# Patient Record
Sex: Male | Born: 1965 | Race: Black or African American | Hispanic: No | Marital: Married | Smoking: Never smoker
Health system: Southern US, Community
[De-identification: ages and names within clinical notes are randomized; demographics above are authoritative.]

## PROBLEM LIST (undated history)

## (undated) DIAGNOSIS — C801 Malignant (primary) neoplasm, unspecified: Secondary | ICD-10-CM

## (undated) DIAGNOSIS — E785 Hyperlipidemia, unspecified: Secondary | ICD-10-CM

## (undated) DIAGNOSIS — R972 Elevated prostate specific antigen [PSA]: Secondary | ICD-10-CM

## (undated) DIAGNOSIS — W3400XA Accidental discharge from unspecified firearms or gun, initial encounter: Secondary | ICD-10-CM

## (undated) DIAGNOSIS — I10 Essential (primary) hypertension: Secondary | ICD-10-CM

## (undated) DIAGNOSIS — Z981 Arthrodesis status: Secondary | ICD-10-CM

## (undated) DIAGNOSIS — R7303 Prediabetes: Secondary | ICD-10-CM

## (undated) HISTORY — PX: PROSTATE BIOPSY: SHX241

## (undated) HISTORY — DX: Essential (primary) hypertension: I10

## (undated) HISTORY — DX: Hyperlipidemia, unspecified: E78.5

## (undated) HISTORY — DX: Accidental discharge from unspecified firearms or gun, initial encounter: W34.00XA

## (undated) HISTORY — PX: ARTHROSCOPIC REPAIR ACL: SUR80

## (undated) HISTORY — DX: Arthrodesis status: Z98.1

---

## 1998-07-10 ENCOUNTER — Encounter: Payer: Self-pay | Admitting: Internal Medicine

## 1998-07-10 ENCOUNTER — Ambulatory Visit (HOSPITAL_COMMUNITY): Admission: RE | Admit: 1998-07-10 | Discharge: 1998-07-10 | Payer: Self-pay | Admitting: Internal Medicine

## 2005-07-08 ENCOUNTER — Ambulatory Visit: Payer: Self-pay | Admitting: Internal Medicine

## 2005-07-29 ENCOUNTER — Ambulatory Visit: Payer: Self-pay | Admitting: Internal Medicine

## 2005-09-09 ENCOUNTER — Ambulatory Visit: Payer: Self-pay | Admitting: Internal Medicine

## 2006-10-13 ENCOUNTER — Encounter: Payer: Self-pay | Admitting: Internal Medicine

## 2006-10-13 DIAGNOSIS — Z981 Arthrodesis status: Secondary | ICD-10-CM | POA: Insufficient documentation

## 2006-10-14 ENCOUNTER — Emergency Department (HOSPITAL_COMMUNITY): Admission: EM | Admit: 2006-10-14 | Discharge: 2006-10-14 | Payer: Self-pay | Admitting: *Deleted

## 2007-10-04 ENCOUNTER — Ambulatory Visit: Payer: Self-pay | Admitting: Internal Medicine

## 2007-10-05 DIAGNOSIS — E785 Hyperlipidemia, unspecified: Secondary | ICD-10-CM | POA: Insufficient documentation

## 2007-10-05 DIAGNOSIS — I10 Essential (primary) hypertension: Secondary | ICD-10-CM | POA: Insufficient documentation

## 2007-11-07 ENCOUNTER — Ambulatory Visit: Payer: Self-pay | Admitting: Internal Medicine

## 2007-11-07 LAB — CONVERTED CEMR LAB
ALT: 62 units/L — ABNORMAL HIGH (ref 0–53)
AST: 33 units/L (ref 0–37)
Albumin: 4 g/dL (ref 3.5–5.2)
Alkaline Phosphatase: 79 units/L (ref 39–117)
BUN: 19 mg/dL (ref 6–23)
Bilirubin, Direct: 0.2 mg/dL (ref 0.0–0.3)
CO2: 31 meq/L (ref 19–32)
Calcium: 9.2 mg/dL (ref 8.4–10.5)
Chloride: 105 meq/L (ref 96–112)
Cholesterol: 164 mg/dL (ref 0–200)
Creatinine, Ser: 1.2 mg/dL (ref 0.4–1.5)
GFR calc Af Amer: 86 mL/min
GFR calc non Af Amer: 71 mL/min
Glucose, Bld: 103 mg/dL — ABNORMAL HIGH (ref 70–99)
HDL: 42.4 mg/dL (ref >39.0)
LDL Cholesterol: 103 mg/dL — ABNORMAL HIGH (ref 0–99)
Potassium: 3.2 meq/L — ABNORMAL LOW (ref 3.5–5.1)
Sodium: 142 meq/L (ref 135–145)
Total Bilirubin: 1 mg/dL (ref 0.3–1.2)
Total CHOL/HDL Ratio: 3.9
Total Protein: 7 g/dL (ref 6.0–8.3)
Triglycerides: 93 mg/dL (ref 0–149)
VLDL: 19 mg/dL (ref 0–40)

## 2007-11-09 ENCOUNTER — Ambulatory Visit: Payer: Self-pay | Admitting: Internal Medicine

## 2007-11-09 DIAGNOSIS — E663 Overweight: Secondary | ICD-10-CM | POA: Insufficient documentation

## 2008-11-27 ENCOUNTER — Telehealth: Payer: Self-pay | Admitting: Internal Medicine

## 2010-08-20 ENCOUNTER — Telehealth: Payer: Self-pay | Admitting: *Deleted

## 2010-08-20 MED ORDER — HYDROCHLOROTHIAZIDE 25 MG PO TABS: 25.0000 mg | ORAL_TABLET | Freq: Every day | ORAL | Status: DC

## 2010-08-20 MED ORDER — SIMVASTATIN 40 MG PO TABS: 40.0000 mg | ORAL_TABLET | Freq: Every evening | ORAL | Status: DC

## 2010-08-20 NOTE — Telephone Encounter (Signed)
Requesting RX refills for Simvastatin 40 & HCTZ 25 to [facility]. Rx[s] Done. At Pt request, #15 of each Rx to [facility] [address] [out of meds] Done. Pt informed.

## 2010-11-12 LAB — POCT CARDIAC MARKERS
CKMB, poc: 1.9
Myoglobin, poc: 68.8
Operator id: 4074
Troponin i, poc: <0.05

## 2010-11-12 LAB — BASIC METABOLIC PANEL
BUN: 16
CO2: 28
Calcium: 9.1
Chloride: 111
Creatinine, Ser: 1.4
GFR calc Af Amer: >60
GFR calc non Af Amer: 56 — ABNORMAL LOW
Glucose, Bld: 103 — ABNORMAL HIGH
Potassium: 4
Sodium: 143

## 2011-05-21 ENCOUNTER — Other Ambulatory Visit: Payer: Self-pay | Admitting: Internal Medicine

## 2011-09-01 ENCOUNTER — Encounter: Payer: Self-pay | Admitting: Internal Medicine

## 2011-09-01 ENCOUNTER — Other Ambulatory Visit (INDEPENDENT_AMBULATORY_CARE_PROVIDER_SITE_OTHER): Payer: 59

## 2011-09-01 ENCOUNTER — Ambulatory Visit (INDEPENDENT_AMBULATORY_CARE_PROVIDER_SITE_OTHER): Payer: 59 | Admitting: Internal Medicine

## 2011-09-01 VITALS — BP 142/90 | HR 98 | Temp 99.0°F | Resp 16 | Wt 237.0 lb

## 2011-09-01 DIAGNOSIS — R6889 Other general symptoms and signs: Secondary | ICD-10-CM

## 2011-09-01 DIAGNOSIS — E785 Hyperlipidemia, unspecified: Secondary | ICD-10-CM

## 2011-09-01 DIAGNOSIS — I10 Essential (primary) hypertension: Secondary | ICD-10-CM

## 2011-09-01 DIAGNOSIS — R899 Unspecified abnormal finding in specimens from other organs, systems and tissues: Secondary | ICD-10-CM

## 2011-09-01 DIAGNOSIS — Z0000 Encounter for general adult medical examination without abnormal findings: Secondary | ICD-10-CM

## 2011-09-01 DIAGNOSIS — E663 Overweight: Secondary | ICD-10-CM

## 2011-09-01 DIAGNOSIS — Z23 Encounter for immunization: Secondary | ICD-10-CM

## 2011-09-01 LAB — COMPREHENSIVE METABOLIC PANEL
AST: 22 U/L (ref 0–37)
Albumin: 4.3 g/dL (ref 3.5–5.2)
Alkaline Phosphatase: 89 U/L (ref 39–117)
Potassium: 4.3 mEq/L (ref 3.5–5.1)
Sodium: 141 mEq/L (ref 135–145)
Total Bilirubin: 0.7 mg/dL (ref 0.3–1.2)
Total Protein: 7.6 g/dL (ref 6.0–8.3)

## 2011-09-01 LAB — LIPID PANEL
Cholesterol: 247 mg/dL — ABNORMAL HIGH (ref 0–200)
HDL: 46 mg/dL (ref >39.00)
Triglycerides: 152 mg/dL — ABNORMAL HIGH (ref 0.0–149.0)

## 2011-09-01 LAB — HEPATIC FUNCTION PANEL
ALT: 36 U/L (ref 0–53)
AST: 22 U/L (ref 0–37)
Albumin: 4.3 g/dL (ref 3.5–5.2)
Total Protein: 7.6 g/dL (ref 6.0–8.3)

## 2011-09-01 LAB — HEMOGLOBIN A1C: Hgb A1c MFr Bld: 4.9 % (ref 4.6–6.5)
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
## 2011-09-02 LAB — LDL CHOLESTEROL, DIRECT: Direct LDL: 178.1 mg/dL

## 2011-09-07 DIAGNOSIS — Z0000 Encounter for general adult medical examination without abnormal findings: Secondary | ICD-10-CM | POA: Insufficient documentation

## 2011-09-07 NOTE — Assessment & Plan Note (Signed)
BP Readings from Last 3 Encounters:  09/01/11 142/90  11/09/07 130/88  10/04/07 158/98   Borderline control.  Plan Continue present medications  Weight management  Monitor BP from time to time and call if SBP is consistently greater than 140.

## 2011-09-07 NOTE — Assessment & Plan Note (Signed)
BMI 30.3 - obesity class I  Discussed the importance of weight management: smart food choices, PORTION SIZE CONTROL and regular exercise.  Plan To loose weight: 1 before per month to a target BMI of 25-28.

## 2011-09-07 NOTE — Assessment & Plan Note (Signed)
Interval history w/o major illness, injury or surgery. Physical exam is normal except for weight. Lab results are in normal range except for LDL cholesterol. He is provided a tetanus shot at today's visit.  In summary - a nice man who is medically stable except for poorly controlled cholesterol. He is strongly encouraged to exercise and to loose weight. He will return for lab in 6 months otherwise he will return as needed.

## 2011-09-07 NOTE — Assessment & Plan Note (Signed)
LDL is above the treatment threshold of 160 or greater. Previous LDL in '09 was 103. Question of adherence to medical therapy.  Plan  Continue simvastatin 40 mg daily  Low fat diet  Repeat lab work in 6 months.

## 2011-11-07 ENCOUNTER — Other Ambulatory Visit: Payer: Self-pay | Admitting: Internal Medicine

## 2011-11-07 MED ORDER — HYDROCHLOROTHIAZIDE 25 MG PO TABS: 25.0000 mg | ORAL_TABLET | Freq: Every day | ORAL | Status: DC

## 2011-11-07 MED ORDER — SIMVASTATIN 40 MG PO TABS: 40.0000 mg | ORAL_TABLET | Freq: Every day | ORAL | Status: DC

## 2011-11-07 NOTE — Telephone Encounter (Signed)
MEDICATIONS ORDERED AND SENT TO [facility].

## 2011-11-07 NOTE — Telephone Encounter (Signed)
The pt called the triage line and is hoping to get a refill of his two medications , sent through [facility].  Thanks!

## 2012-11-29 ENCOUNTER — Other Ambulatory Visit: Payer: Self-pay | Admitting: Internal Medicine

## 2013-01-30 ENCOUNTER — Other Ambulatory Visit (INDEPENDENT_AMBULATORY_CARE_PROVIDER_SITE_OTHER): Payer: 59

## 2013-01-30 ENCOUNTER — Encounter: Payer: Self-pay | Admitting: Internal Medicine

## 2013-01-30 ENCOUNTER — Ambulatory Visit (INDEPENDENT_AMBULATORY_CARE_PROVIDER_SITE_OTHER): Payer: 59 | Admitting: Internal Medicine

## 2013-01-30 VITALS — BP 126/80 | HR 69 | Temp 98.5°F | Ht 73.0 in | Wt 239.0 lb

## 2013-01-30 DIAGNOSIS — E785 Hyperlipidemia, unspecified: Secondary | ICD-10-CM

## 2013-01-30 DIAGNOSIS — I10 Essential (primary) hypertension: Secondary | ICD-10-CM

## 2013-01-30 DIAGNOSIS — Z8042 Family history of malignant neoplasm of prostate: Secondary | ICD-10-CM

## 2013-01-30 DIAGNOSIS — E663 Overweight: Secondary | ICD-10-CM

## 2013-01-30 DIAGNOSIS — Z0000 Encounter for general adult medical examination without abnormal findings: Secondary | ICD-10-CM

## 2013-01-30 LAB — COMPREHENSIVE METABOLIC PANEL
Albumin: 4.5 g/dL (ref 3.5–5.2)
Alkaline Phosphatase: 79 U/L (ref 39–117)
Calcium: 9.3 mg/dL (ref 8.4–10.5)
Chloride: 103 mEq/L (ref 96–112)
Glucose, Bld: 90 mg/dL (ref 70–99)
Potassium: 3.8 mEq/L (ref 3.5–5.1)
Sodium: 139 mEq/L (ref 135–145)
Total Bilirubin: 1 mg/dL (ref 0.3–1.2)
Total Protein: 7.1 g/dL (ref 6.0–8.3)

## 2013-01-30 LAB — HEPATIC FUNCTION PANEL
ALT: 36 U/L (ref 0–53)
AST: 22 U/L (ref 0–37)
Albumin: 4.5 g/dL (ref 3.5–5.2)

## 2013-01-30 LAB — LIPID PANEL
Cholesterol: 174 mg/dL (ref 0–200)
LDL Cholesterol: 117 mg/dL — ABNORMAL HIGH (ref 0–99)
Total CHOL/HDL Ratio: 4
VLDL: 12.8 mg/dL (ref 0.0–40.0)

## 2013-01-30 LAB — PSA: PSA: 0.93 ng/mL (ref 0.10–4.00)

## 2013-01-30 NOTE — Progress Notes (Signed)
Pre visit review using our clinic review tool, if applicable. No additional management support is needed unless otherwise documented below in the visit note. 

## 2013-01-30 NOTE — Patient Instructions (Signed)
Thanks for coming in and I wish you a Happy New year.  Your exam is normal  Lab is ordered including the blood test for prostate cancer - PSA. Results will be posted to MyChart or mailed.  You are current with Tdap.  Have a great year.
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
## 2013-02-01 NOTE — Assessment & Plan Note (Signed)
Lipid panel with LDL better than goal of 130 or less; HDL is better than goal of 40+. Liver functions are normal.  Plan Continue present medications

## 2013-02-01 NOTE — Assessment & Plan Note (Signed)
BP Readings from Last 3 Encounters:  01/30/13 126/80  09/01/11 142/90  11/09/07 130/88   Adequate control on monotherapy with diuretic.

## 2013-02-01 NOTE — Assessment & Plan Note (Signed)
Interval history is negative for major illness, surgery or injury. Physical exam is normal. Lab results reviewed - in normal range. Immunization is current. Discussed pros and cons of prostate cancer screening (USPHCTF recommendations reviewed and ACU April '13 recommendations). He has two brothers with prostate cancer history. Due to increased risk PSA ordered - results: normal PSA @0 .93.  In summary A very nice man who is medical stable.

## 2013-02-01 NOTE — Assessment & Plan Note (Signed)
Body mass index is 31.54 kg/(m^2).  Diet management: smart food choices, PORTION SIZE CONTROL, regular exercise. Goal - to loose 1-2 lbs.month. Target weight - 200 lbs

## 2013-02-02 ENCOUNTER — Encounter: Payer: Self-pay | Admitting: Internal Medicine

## 2013-06-25 ENCOUNTER — Telehealth: Payer: Self-pay | Admitting: Internal Medicine

## 2013-06-25 NOTE — Telephone Encounter (Signed)
Both OK X 90 days

## 2013-06-25 NOTE — Telephone Encounter (Signed)
Pt called request refill for Cozor and Hydrodiuril [facility] (pt request 90 days). Pt is on list with Dr. Dorise Hiss. Please advise. Last ov with Norins was CPE 12/2012.

## 2013-06-26 MED ORDER — HYDROCHLOROTHIAZIDE 25 MG PO TABS: ORAL_TABLET | ORAL | Status: DC

## 2013-06-26 MED ORDER — SIMVASTATIN 40 MG PO TABS: ORAL_TABLET | ORAL | Status: DC

## 2013-06-26 NOTE — Telephone Encounter (Signed)
Spoke with pt advised Rx sent to [facility]

## 2013-09-02 ENCOUNTER — Ambulatory Visit (INDEPENDENT_AMBULATORY_CARE_PROVIDER_SITE_OTHER): Payer: 59 | Admitting: Internal Medicine

## 2013-09-02 ENCOUNTER — Encounter: Payer: Self-pay | Admitting: Internal Medicine

## 2013-09-02 ENCOUNTER — Other Ambulatory Visit: Payer: Self-pay | Admitting: Internal Medicine

## 2013-09-02 ENCOUNTER — Other Ambulatory Visit (INDEPENDENT_AMBULATORY_CARE_PROVIDER_SITE_OTHER): Payer: 59

## 2013-09-02 VITALS — BP 132/88 | HR 69 | Temp 98.8°F | Wt 249.4 lb

## 2013-09-02 DIAGNOSIS — E669 Obesity, unspecified: Secondary | ICD-10-CM | POA: Insufficient documentation

## 2013-09-02 DIAGNOSIS — E785 Hyperlipidemia, unspecified: Secondary | ICD-10-CM

## 2013-09-02 DIAGNOSIS — Z833 Family history of diabetes mellitus: Secondary | ICD-10-CM

## 2013-09-02 DIAGNOSIS — I10 Essential (primary) hypertension: Secondary | ICD-10-CM

## 2013-09-02 DIAGNOSIS — E663 Overweight: Secondary | ICD-10-CM

## 2013-09-02 LAB — HEPATIC FUNCTION PANEL
ALBUMIN: 4.1 g/dL (ref 3.5–5.2)
ALK PHOS: 84 U/L (ref 39–117)
ALT: 45 U/L (ref 0–53)
AST: 27 U/L (ref 0–37)
Bilirubin, Direct: 0.1 mg/dL (ref 0.0–0.3)
TOTAL PROTEIN: 7 g/dL (ref 6.0–8.3)
Total Bilirubin: 0.6 mg/dL (ref 0.2–1.2)

## 2013-09-02 LAB — BASIC METABOLIC PANEL
BUN: 16 mg/dL (ref 6–23)
CALCIUM: 9.3 mg/dL (ref 8.4–10.5)
CO2: 29 mEq/L (ref 19–32)
Chloride: 102 mEq/L (ref 96–112)
Creatinine, Ser: 1.1 mg/dL (ref 0.4–1.5)
GFR: 90.78 mL/min (ref >60.00)
GLUCOSE: 101 mg/dL — AB (ref 70–99)
Potassium: 4.1 mEq/L (ref 3.5–5.1)
Sodium: 136 mEq/L (ref 135–145)

## 2013-09-02 LAB — HEMOGLOBIN A1C: Hgb A1c MFr Bld: 5.3 % (ref 4.6–6.5)

## 2013-09-02 LAB — TSH: TSH: 1.39 u[IU]/mL (ref 0.35–4.50)

## 2013-09-02 NOTE — Assessment & Plan Note (Signed)
Blood pressure goals reviewed. BMET 

## 2013-09-02 NOTE — Progress Notes (Signed)
   Subjective:    Patient ID: [employer], Theodore Whitney    DOB: 08/14/1965, 48 y.o.   MRN: [id number]  HPI    He has been compliant with his antihypertensive and statin without adverse effect.  He is not on a low-salt heart healthy diet.  He is not monitoring his blood pressure at home.  He exercises 30 minutes 3-4 times a week as walking his dog  He has had no lab evaluation since December 2014. LDL was 117.  He is not on low-dose aspirin.  Family history is positive for hypertension in both parents. His father had a stroke at 8360; father diabetic      Review of Systems   Chest pain, palpitations, tachycardia, exertional dyspnea, paroxysmal nocturnal dyspnea, claudication or edema are absent.  He was inquiring about having skin tags removed       Objective:   Physical Exam Appears healthy and well-nourished & in no acute distress  He has a mustache and beard.  Arcus senilis is present bilaterally.  No ear lobe crease  present  No carotid bruits are present.No neck pain distention present at 10 - 15 degrees. Thyroid normal to palpation  Heart rhythm and rate are normal with no gallop or murmur  Chest is clear with no increased work of breathing  There is no evidence of aortic aneurysm or renal artery bruits  Abdomen Protuberant but soft with no organomegaly or masses. Ventral hernia.No HJR  No clubbing, cyanosis or edema present.  Pedal pulses are intact   No ischemic skin changes are present . Minor skin tags present with no sign of inflammation or pathologic change. Fingernails healthy   Alert and oriented. Strength, tone, DTRs reflexes normal          Assessment & Plan:  See Current Assessment & Plan in Problem List under specific Diagnosis

## 2013-09-02 NOTE — Progress Notes (Signed)
   Subjective:    Patient ID: [employer], male    DOB: 1965/10/06, 48 y.o.   MRN: [id number]  HPI Blood pressure range / average : Not monitored  Compliant with anti hypertensive medication. No lightheadedness or other adverse medication effect described.  A heart healthy /low salt diet is not followed. Exercise encompasses 30 minutes 3-4  times per week as  walking without symptoms.  Family history is positive for HTN- Mother, Father   Positive for CVA- Father about age 46  Last cholesterol testing 01/30/13 reveals 117 LDL,  goal is less than 100.  There is medication compliance with the statin.  Low dose ASA is not taken  Weight was discussed as brought up by patient. He now has a treadmill at home and would like to lose some weight.     Review of Systems Significant headaches, epistaxis, chest pain, palpitations, exertional dyspnea, claudication, paroxysmal nocturnal dyspnea, or edema absent.  Significant abdominal symptoms, memory deficit, or myalgias not present.    Objective:   Physical Exam  Constitutional: He is oriented to person, place, and time. No distress.  Muscular build  HENT:  Right Ear: External ear normal.  Left Ear: External ear normal.  Mouth/Throat: Oropharynx is clear and moist. No oropharyngeal exudate.  Eyes: Conjunctivae and EOM are normal. Pupils are equal, round, and reactive to light. Right eye exhibits no discharge. Left eye exhibits no discharge. No scleral icterus.  Neck: Normal range of motion. Neck supple. No JVD present. No tracheal deviation present. No thyromegaly present.  Cardiovascular: Normal rate, regular rhythm, normal heart sounds and intact distal pulses.  Exam reveals no gallop and no friction rub.   No murmur heard. Pulmonary/Chest: Effort normal and breath sounds normal. No respiratory distress. He has no wheezes. He has no rales. He exhibits no tenderness.  Abdominal: Soft. Bowel sounds are normal. He exhibits no distension and  no mass. There is no tenderness. There is no rebound and no guarding.  Musculoskeletal: Normal range of motion. He exhibits no edema and no tenderness.  Right knee had ACL surgery 15+ years ago, Crepitus noted on ROM  Lymphadenopathy:    He has no cervical adenopathy.  Neurological: He is alert and oriented to person, place, and time. He displays normal reflexes. No cranial nerve deficit. He exhibits normal muscle tone. Coordination normal.  Skin: Skin is warm and dry. No rash noted. He is not diaphoretic. No erythema. No pallor.  Psychiatric: He has a normal mood and affect. His behavior is normal. Judgment and thought content normal.          Assessment & Plan:

## 2013-09-02 NOTE — Progress Notes (Signed)
Pre visit review using our clinic review tool, if applicable. No additional management support is needed unless otherwise documented below in the visit note. 

## 2013-09-02 NOTE — Assessment & Plan Note (Signed)
Lipids, LFTs, TSH  

## 2013-09-02 NOTE — Patient Instructions (Signed)
Your next office appointment will be determined based upon review of your pending labs. Those instructions will be transmitted to you through My Chart  Minimal Blood Pressure Goal= AVERAGE < 140/90;  Ideal is an AVERAGE < 135/85. This AVERAGE should be calculated from @ least 5-7 BP readings taken @ different times of day on different days of week. You should not respond to isolated BP readings , but rather the AVERAGE for that week .Please bring your  blood pressure cuff to office visits to verify that it is reliable.It  can also be checked against the blood pressure device at the pharmacy. Finger or wrist cuffs are not dependable; an arm cuff is. Eat a low-fat diet with lots of fruits and vegetables, up to 7-9 servings per day. Consume less than 40  Grams (preferably ZERO) of sugar per day from foods & drinks with High Fructose Corn Syrup (HFCS) sugar as #1,2,3 or # 4 on label.Whole Foods, [employer] & [employer] do not carry products with HFCS. Follow a  low carb nutrition program such as [place] or The New Sugar Busters  to prevent Diabetes  . White carbohydrates (potatoes, rice, bread, and pasta) have a high spike of sugar and a high load of sugar. For example a  baked potato has a cup of sugar and a  french fry  2 teaspoons of sugar. Yams, wild  rice, whole grained bread &  wheat pasta have been much lower spike and load of  sugar. Portions should be the size of a deck of cards or your palm.

## 2013-09-02 NOTE — Assessment & Plan Note (Signed)
See AVS

## 2013-09-03 LAB — NMR LIPOPROFILE WITH LIPIDS
CHOLESTEROL, TOTAL: 183 mg/dL (ref <200)
HDL PARTICLE NUMBER: 30 umol/L — AB (ref >=30.5)
HDL Size: 8.8 nm — ABNORMAL LOW (ref >=9.2)
HDL-C: 48 mg/dL (ref >=40)
LARGE VLDL-P: 1 nmol/L (ref <=2.7)
LDL (calc): 113 mg/dL — ABNORMAL HIGH (ref <100)
LDL PARTICLE NUMBER: 1891 nmol/L — AB (ref <1000)
LDL Size: 20.6 nm (ref >20.5)
LP-IR Score: 45 (ref <=45)
Large HDL-P: 4.8 umol/L (ref >=4.8)
SMALL LDL PARTICLE NUMBER: 890 nmol/L — AB (ref <=527)
Triglycerides: 112 mg/dL (ref <150)
VLDL Size: 50.3 nm — ABNORMAL HIGH (ref <=46.6)

## 2013-09-09 ENCOUNTER — Encounter: Payer: Self-pay | Admitting: Internal Medicine

## 2014-02-03 ENCOUNTER — Encounter: Payer: Self-pay | Admitting: Internal Medicine

## 2014-02-03 ENCOUNTER — Ambulatory Visit (INDEPENDENT_AMBULATORY_CARE_PROVIDER_SITE_OTHER): Payer: 59 | Admitting: Internal Medicine

## 2014-02-03 ENCOUNTER — Other Ambulatory Visit (INDEPENDENT_AMBULATORY_CARE_PROVIDER_SITE_OTHER): Payer: Self-pay

## 2014-02-03 VITALS — BP 138/92 | HR 80 | Temp 98.2°F | Resp 18 | Ht 73.0 in | Wt 244.6 lb

## 2014-02-03 DIAGNOSIS — I10 Essential (primary) hypertension: Secondary | ICD-10-CM

## 2014-02-03 DIAGNOSIS — Z0000 Encounter for general adult medical examination without abnormal findings: Secondary | ICD-10-CM

## 2014-02-03 DIAGNOSIS — M25561 Pain in right knee: Secondary | ICD-10-CM

## 2014-02-03 LAB — PSA: PSA: 0.94 ng/mL (ref 0.10–4.00)

## 2014-02-03 MED ORDER — DICLOFENAC SODIUM 1 % TD GEL: 2.0000 g | Freq: Two times a day (BID) | TRANSDERMAL | Status: DC | PRN

## 2014-02-03 NOTE — Progress Notes (Signed)
Pre visit review using our clinic review tool, if applicable. No additional management support is needed unless otherwise documented below in the visit note. 

## 2014-02-03 NOTE — Assessment & Plan Note (Signed)
Trial voltaren gel. Advised continued icing and gentle exercising. If no relief with voltaren or he starts to have instability he will return to orthopedics.

## 2014-02-03 NOTE — Assessment & Plan Note (Signed)
Doing well with HCTZ and labs normal 8/15 reviewed.

## 2014-02-03 NOTE — Assessment & Plan Note (Signed)
He declines flu, wants PSA given family history of prostate troubles early in life.

## 2014-02-03 NOTE — Progress Notes (Signed)
   Subjective:    Patient ID: Theodore Whitney, male    DOB: 1965-07-29, 49 y.o.   MRN: [id number]  HPI The patient is a 49 YO man who is coming in to establish care. He has PMH of HTN and hyperlipidemia. He also has been having some right knee pain and swelling since the holidays. He has been icing it and having some mild pain throughout the day with work. He had ACL repair years ago. Denies collapse or giving out. He denies chest pains, SOB, abdominal pains.   Review of Systems  Constitutional: Negative for fever, activity change, appetite change, fatigue and unexpected weight change.  HENT: Negative.   Respiratory: Negative for cough, chest tightness, shortness of breath and wheezing.   Cardiovascular: Negative for chest pain, palpitations and leg swelling.  Gastrointestinal: Negative for abdominal pain, diarrhea, constipation and abdominal distention.  Musculoskeletal: Positive for arthralgias. Negative for myalgias, back pain and gait problem.  Skin: Negative.   Neurological: Negative for dizziness, weakness, light-headedness and headaches.      Objective:   Physical Exam  Constitutional: He is oriented to person, place, and time. He appears well-developed and well-nourished.  HENT:  Head: Normocephalic and atraumatic.  Eyes: EOM are normal.  Neck: Normal range of motion.  Cardiovascular: Normal rate and regular rhythm.   No murmur heard. Pulmonary/Chest: Effort normal and breath sounds normal. No respiratory distress. He has no wheezes. He has no rales.  Abdominal: Soft. Bowel sounds are normal. He exhibits no distension. There is no tenderness. There is no rebound.  Musculoskeletal: He exhibits tenderness. He exhibits no edema.  Mild tenderness along the patella, no tenderness along the pes anserine bursa, mcl, lcl, ACL and PCL intact. Mild swelling of the knee but minimal.   Neurological: He is alert and oriented to person, place, and time. Coordination normal.  Skin: Skin is  warm and dry.   Filed Vitals:   02/03/14 1009  BP: 138/92  Pulse: 80  Temp: 98.2 F (36.8 C)  TempSrc: Oral  Resp: 18  Height:  (1.854 m)  Weight: 244 lb 9.6 oz (110.95 kg)  SpO2: 97%      Assessment & Plan:

## 2014-02-03 NOTE — Patient Instructions (Signed)
We will check your psa today. We will also send in a medicine in a gel called voltaren. You can use it on your knee twice a day for pain or swelling. I would try using it twice a day for about 1 week to see if it helps. If you are still having problems you can go back to [place]. If they need a new referral just call us and we can send that in for you.  We will see you back in about 6-12 months for a check in. If you are having any new problems or questions before then please feel free to call our office.

## 2014-03-19 ENCOUNTER — Telehealth: Payer: Self-pay | Admitting: Internal Medicine

## 2014-03-19 ENCOUNTER — Other Ambulatory Visit: Payer: Self-pay | Admitting: Geriatric Medicine

## 2014-03-19 MED ORDER — HYDROCHLOROTHIAZIDE 25 MG PO TABS: ORAL_TABLET | ORAL | Status: DC

## 2014-03-19 MED ORDER — DICLOFENAC SODIUM 1 % TD GEL: 2.0000 g | Freq: Two times a day (BID) | TRANSDERMAL | Status: DC | PRN

## 2014-03-19 NOTE — Telephone Encounter (Signed)
Pt called request for diclofenac sodium (VOLTAREN) 1 % GEL to be send to [facility] on [address], and hydrochlorothiazide (HYDRODIURIL) 25 MG tablet and simvastatin [facility] for 90 days supply.

## 2014-03-19 NOTE — Telephone Encounter (Signed)
Sent to indicated pharmacies.

## 2014-03-26 ENCOUNTER — Telehealth: Payer: Self-pay | Admitting: *Deleted

## 2014-03-26 MED ORDER — HYDROCHLOROTHIAZIDE 25 MG PO TABS: ORAL_TABLET | ORAL | Status: DC

## 2014-03-26 MED ORDER — SIMVASTATIN 40 MG PO TABS: ORAL_TABLET | ORAL | Status: DC

## 2014-03-26 NOTE — Telephone Encounter (Signed)
Left msg on triage trying to get [facility]. Called pt back no answer LMOM RTC...Raechel Chute/lmb

## 2014-03-26 NOTE — Telephone Encounter (Signed)
Pt return call back he stated that he is needing his simvastatin & HCTZ currently out of both meds. inform him md sent refill on HCTZ 03/19/14. Will send both meds again, also [facility] since he is out...Raechel Chute/lmb

## 2015-08-14 ENCOUNTER — Telehealth: Payer: Self-pay

## 2015-08-14 MED ORDER — HYDROCHLOROTHIAZIDE 25 MG PO TABS: ORAL_TABLET | ORAL | Status: DC

## 2015-08-14 MED ORDER — SIMVASTATIN 40 MG PO TABS: ORAL_TABLET | ORAL | Status: DC

## 2015-08-14 NOTE — Telephone Encounter (Signed)
Pt called requesting 1 week supply of cholesterol and BP medication until appointment with Dr Okey Duprerawford on 08/21/2015.  Rx refill sent

## 2015-08-21 ENCOUNTER — Other Ambulatory Visit: Payer: Self-pay | Admitting: *Deleted

## 2015-08-21 ENCOUNTER — Encounter: Payer: Self-pay | Admitting: Internal Medicine

## 2015-08-21 MED ORDER — HYDROCHLOROTHIAZIDE 25 MG PO TABS: ORAL_TABLET | ORAL | Status: DC

## 2015-08-21 MED ORDER — SIMVASTATIN 40 MG PO TABS: ORAL_TABLET | ORAL | Status: DC

## 2015-08-21 NOTE — Telephone Encounter (Signed)
Pt left msg on triage yesterday afternoon stating he had to reschedule his appt to 7/31, needing to get a refill on meds until he come in. Called pt no ander will only send in #10 pills until appt must keep appt for future refills...Raechel Chute/lmb

## 2015-08-31 ENCOUNTER — Telehealth: Payer: Self-pay

## 2015-08-31 ENCOUNTER — Other Ambulatory Visit (INDEPENDENT_AMBULATORY_CARE_PROVIDER_SITE_OTHER): Payer: 59

## 2015-08-31 ENCOUNTER — Ambulatory Visit (INDEPENDENT_AMBULATORY_CARE_PROVIDER_SITE_OTHER): Payer: 59 | Admitting: Internal Medicine

## 2015-08-31 ENCOUNTER — Encounter: Payer: Self-pay | Admitting: Internal Medicine

## 2015-08-31 VITALS — BP 122/82 | HR 89 | Temp 98.0°F | Wt 242.0 lb

## 2015-08-31 DIAGNOSIS — E785 Hyperlipidemia, unspecified: Secondary | ICD-10-CM

## 2015-08-31 DIAGNOSIS — I10 Essential (primary) hypertension: Secondary | ICD-10-CM | POA: Diagnosis not present

## 2015-08-31 DIAGNOSIS — Z0000 Encounter for general adult medical examination without abnormal findings: Secondary | ICD-10-CM

## 2015-08-31 LAB — CBC
HCT: 41.4 % (ref 39.0–52.0)
Hemoglobin: 14 g/dL (ref 13.0–17.0)
MCHC: 33.8 g/dL (ref 30.0–36.0)
MCV: 95.2 fl (ref 78.0–100.0)
PLATELETS: 253 10*3/uL (ref 150.0–400.0)
RBC: 4.34 Mil/uL (ref 4.22–5.81)
RDW: 13.2 % (ref 11.5–15.5)
WBC: 4.9 10*3/uL (ref 4.0–10.5)

## 2015-08-31 LAB — COMPREHENSIVE METABOLIC PANEL
ALK PHOS: 95 U/L (ref 39–117)
ALT: 30 U/L (ref 0–53)
AST: 21 U/L (ref 0–37)
Albumin: 4.3 g/dL (ref 3.5–5.2)
BILIRUBIN TOTAL: 0.8 mg/dL (ref 0.2–1.2)
BUN: 15 mg/dL (ref 6–23)
CO2: 29 mEq/L (ref 19–32)
Calcium: 9.5 mg/dL (ref 8.4–10.5)
Chloride: 101 mEq/L (ref 96–112)
Creatinine, Ser: 1.22 mg/dL (ref 0.40–1.50)
GFR: 81.55 mL/min (ref >60.00)
GLUCOSE: 102 mg/dL — AB (ref 70–99)
POTASSIUM: 3.4 meq/L — AB (ref 3.5–5.1)
SODIUM: 139 meq/L (ref 135–145)
TOTAL PROTEIN: 7.1 g/dL (ref 6.0–8.3)

## 2015-08-31 LAB — LIPID PANEL
Cholesterol: 185 mg/dL (ref 0–200)
HDL: 43.5 mg/dL (ref >39.00)
LDL Cholesterol: 118 mg/dL — ABNORMAL HIGH (ref 0–99)
NONHDL: 141.47
Total CHOL/HDL Ratio: 4
Triglycerides: 115 mg/dL (ref 0.0–149.0)
VLDL: 23 mg/dL (ref 0.0–40.0)

## 2015-08-31 LAB — PSA: PSA: 1.3 ng/mL (ref 0.10–4.00)

## 2015-08-31 MED ORDER — SIMVASTATIN 40 MG PO TABS: ORAL_TABLET | ORAL | 1 refills | Status: DC

## 2015-08-31 MED ORDER — HYDROCHLOROTHIAZIDE 25 MG PO TABS: ORAL_TABLET | ORAL | 1 refills | Status: DC

## 2015-08-31 NOTE — Assessment & Plan Note (Signed)
BP at goal on hctz 25 mg daily, refill sent in. Checking CMP and adjust as needed.

## 2015-08-31 NOTE — Progress Notes (Signed)
Pre visit review using our clinic review tool, if applicable. No additional management support is needed unless otherwise documented below in the visit note. 

## 2015-08-31 NOTE — Telephone Encounter (Signed)
Error

## 2015-08-31 NOTE — Telephone Encounter (Signed)
error 

## 2015-08-31 NOTE — Assessment & Plan Note (Signed)
Tdap and flu shot up to date. No indication for early colon cancer screening. Checking labs today and counseling on sun safety and mole surveillance. Given screening recommendations.

## 2015-08-31 NOTE — Assessment & Plan Note (Signed)
Checking lipid panel and adjust as needed. Taking simvastatin 40 mg daily.  

## 2015-08-31 NOTE — Progress Notes (Signed)
   Subjective:    Patient ID: Theodore Whitney, male    DOB: 1965/06/03, 50 y.o.   MRN: [id number]  HPI The patient is a 50 YO man coming in for wellness. Non-smoker and exercises some.   PMH, [facility], social history reviewed and updated.   Review of Systems  Constitutional: Negative for activity change, appetite change, fatigue, fever and unexpected weight change.  HENT: Negative.   Eyes: Negative.   Respiratory: Negative for cough, chest tightness, shortness of breath and wheezing.   Cardiovascular: Negative for chest pain, palpitations and leg swelling.  Gastrointestinal: Negative for abdominal distention, abdominal pain, constipation and diarrhea.  Musculoskeletal: Positive for arthralgias. Negative for back pain, gait problem and myalgias.  Skin: Negative.   Neurological: Negative for dizziness, weakness, light-headedness and headaches.  Psychiatric/Behavioral: Negative.       Objective:   Physical Exam  Constitutional: He is oriented to person, place, and time. He appears well-developed and well-nourished.  HENT:  Head: Normocephalic and atraumatic.  Eyes: EOM are normal.  Neck: Normal range of motion.  Cardiovascular: Normal rate and regular rhythm.   No murmur heard. Pulmonary/Chest: Effort normal and breath sounds normal. No respiratory distress. He has no wheezes. He has no rales.  Abdominal: Soft. Bowel sounds are normal. He exhibits no distension. There is no tenderness. There is no rebound.  Musculoskeletal: He exhibits no edema or tenderness.  Neurological: He is alert and oriented to person, place, and time. Coordination normal.  Skin: Skin is warm and dry.  Psychiatric: He has a normal mood and affect.   Vitals:   08/31/15 1326  BP: 122/82  Pulse: 89  Temp: 98 F (36.7 C)  SpO2: 98%  Weight: 242 lb (109.8 kg)      Assessment & Plan:

## 2015-08-31 NOTE — Patient Instructions (Signed)
We are checking the labs today and will send the results on mychart.   Serving Sizes A serving size is a measured amount of food or drink, such as one slice of bread, that has an associated nutrient content. Knowing the serving size of a food or drink can help you determine how much of that food you should consume.  WHAT IS THE SIZE OF ONE SERVING? The size of one healthy serving depends on the food or drink. To determine a serving size, read the food label. If the food or drink does not have a food label, try to find serving size information online. Or, use the following to estimate the size of one adult serving:  Grain 1 slice bread.  bagel.  cup pasta.  Vegetable  cup cooked or canned vegetables. 1 cup raw, leafy greens.  Fruit  cup canned fruit. 1 medium fruit.  cup dried fruit.  Meat and Other Protein Sources 1 oz meat, poultry, or fish.  cup cooked beans. 1 egg.  cup nuts or seeds. 1 Tbsp nut butter.  cup tofu or tempeh. 2 Tbsp hummus.  Dairy An individual container of yogurt (6-8 oz). 1 piece of cheese the size of your thumb (1 oz). 1 cup (8 oz) milk or milk alternative.  Fat A piece the size of one dice. 1 tsp soft margarine. 1 Tbsp mayonnaise. 1 tsp vegetable oil. 1 Tbsp regular salad dressing. 2 Tbsp low-fat salad dressing.  HOW MANY SERVINGS SHOULD I EAT FROM EACH FOOD GROUP EACH DAY?  The following are the suggested number of servings to try and have every day from each food group. You can also look at your eating throughout the week and aim for meeting these requirements on most days for overall healthy eating.  Grain 6-8 servings. Try to have half of your grains from whole grains, such as whole wheat bread, corn tortillas, oatmeal, brown rice, whole wheat pasta, and bulgur. Vegetable At least 2-3 servings.  Fruit 2 servings.  Meat and Other Protein Foods 5-6 servings. Aim to have lean proteins, such as chicken, [place], fish, beans, or tofu. Dairy 3 servings. Choose  low-fat or nonfat if you are trying to control your weight.  Fat 2-3 servings.  IS A SERVING THE SAME THING AS A PORTION? No. A portion is the actual amount you eat, which may be more than one serving. Knowing the specific serving size of a food and the nutritional information that goes with it can help you make a healthy decision on what size portion to eat.  WHAT ARE SOME TIPS TO HELP ME LEARN HEALTHY SERVING SIZES?  Check food labels for serving sizes. Many foods that come as a single portion actually contain multiple servings.  Determine the serving size of foods you commonly eat and figure out how large a portion you usually eat.  Measure the number of servings that can be held by the bowls, glasses, cups, and plates you typically use. For example, pour your breakfast cereal into your regular bowl and then pour it into a measuring cup.  For 1-2 days, measure the serving sizes of all the foods you eat.  Practice estimating serving sizes and determining how big your portions should be.   This information is not intended to replace advice given to you by your health care provider. Make sure you discuss any questions you have with your health care provider.   Document Released: 10/16/2002 Document Revised: 02/07/2014 Document Reviewed: 04/16/2013 Elsevier Interactive Patient Education  2016 Elsevier Inc.  Health Maintenance, Male A healthy lifestyle and preventative care can promote health and wellness.  Maintain regular health, dental, and eye exams.  Eat a healthy diet. Foods like vegetables, fruits, whole grains, low-fat dairy products, and lean protein foods contain the nutrients you need and are low in calories. Decrease your intake of foods high in solid fats, added sugars, and salt. Get information about a proper diet from your health care provider, if necessary.  Regular physical exercise is one of the most important things you can do for your health. Most adults should get at  least 150 minutes of moderate-intensity exercise (any activity that increases your heart rate and causes you to sweat) each week. In addition, most adults need muscle-strengthening exercises on 2 or more days a week.   Maintain a healthy weight. The body mass index (BMI) is a screening tool to identify possible weight problems. It provides an estimate of body fat based on height and weight. Your health care provider can find your BMI and can help you achieve or maintain a healthy weight. For males 20 years and older:  A BMI below 18.5 is considered underweight.  A BMI of 18.5 to 24.9 is normal.  A BMI of 25 to 29.9 is considered overweight.  A BMI of 30 and above is considered obese.  Maintain normal blood lipids and cholesterol by exercising and minimizing your intake of saturated fat. Eat a balanced diet with plenty of fruits and vegetables. Blood tests for lipids and cholesterol should begin at age 66 and be repeated every 5 years. If your lipid or cholesterol levels are high, you are over age 58, or you are at high risk for heart disease, you may need your cholesterol levels checked more frequently.Ongoing high lipid and cholesterol levels should be treated with medicines if diet and exercise are not working.  If you smoke, find out from your health care provider how to quit. If you do not use tobacco, do not start.  Lung cancer screening is recommended for adults aged 55-80 years who are at high risk for developing lung cancer because of a history of smoking. A yearly low-dose CT scan of the lungs is recommended for people who have at least a 30-pack-year history of smoking and are current smokers or have quit within the past 15 years. A pack year of smoking is smoking an average of 1 pack of cigarettes a day for 1 year (for example, a 30-pack-year history of smoking could mean smoking 1 pack a day for 30 years or 2 packs a day for 15 years). Yearly screening should continue until the smoker  has stopped smoking for at least 15 years. Yearly screening should be stopped for people who develop a health problem that would prevent them from having lung cancer treatment.  If you choose to drink alcohol, do not have more than 2 drinks per day. One drink is considered to be 12 oz (360 mL) of beer, 5 oz (150 mL) of wine, or 1.5 oz (45 mL) of liquor.  Avoid the use of street drugs. Do not share needles with anyone. Ask for help if you need support or instructions about stopping the use of drugs.  High blood pressure causes heart disease and increases the risk of stroke. High blood pressure is more likely to develop in:  People who have blood pressure in the end of the normal range (100-139/85-89 mm Hg).  People who are overweight or obese.  People  who are African American.  If you are 71-63 years of age, have your blood pressure checked every 3-5 years. If you are 16 years of age or older, have your blood pressure checked every year. You should have your blood pressure measured twice--once when you are at a hospital or clinic, and once when you are not at a hospital or clinic. Record the average of the two measurements. To check your blood pressure when you are not at a hospital or clinic, you can use:  An automated blood pressure machine at a pharmacy.  A home blood pressure monitor.  If you are 30-43 years old, ask your health care provider if you should take aspirin to prevent heart disease.  Diabetes screening involves taking a blood sample to check your fasting blood sugar level. This should be done once every 3 years after age 72 if you are at a normal weight and without risk factors for diabetes. Testing should be considered at a younger age or be carried out more frequently if you are overweight and have at least 1 risk factor for diabetes.  Colorectal cancer can be detected and often prevented. Most routine colorectal cancer screening begins at the age of 62 and continues through  age 98. However, your health care provider may recommend screening at an earlier age if you have risk factors for colon cancer. On a yearly basis, your health care provider may provide home test kits to check for hidden blood in the stool. A small camera at the end of a tube may be used to directly examine the colon (sigmoidoscopy or colonoscopy) to detect the earliest forms of colorectal cancer. Talk to your health care provider about this at age 57 when routine screening begins. A direct exam of the colon should be repeated every 5-10 years through age 10, unless early forms of precancerous polyps or small growths are found.  People who are at an increased risk for hepatitis B should be screened for this virus. You are considered at high risk for hepatitis B if:  You were born in a country where hepatitis B occurs often. Talk with your health care provider about which countries are considered high risk.  Your parents were born in a high-risk country and you have not received a shot to protect against hepatitis B (hepatitis B vaccine).  You have HIV or AIDS.  You use needles to inject street drugs.  You live with, or have sex with, someone who has hepatitis B.  You are a man who has sex with other men (MSM).  You get hemodialysis treatment.  You take certain medicines for conditions like cancer, organ transplantation, and autoimmune conditions.  Hepatitis C blood testing is recommended for all people born from [birth year] through 1963 and any individual with known risk factors for hepatitis C.  Healthy men should no longer receive prostate-specific antigen (PSA) blood tests as part of routine cancer screening. Talk to your health care provider about prostate cancer screening.  Testicular cancer screening is not recommended for adolescents or adult males who have no symptoms. Screening includes self-exam, a health care provider exam, and other screening tests. Consult with your health care provider  about any symptoms you have or any concerns you have about testicular cancer.  Practice safe sex. Use condoms and avoid high-risk sexual practices to reduce the spread of sexually transmitted infections (STIs).  You should be screened for STIs, including gonorrhea and chlamydia if:  You are sexually active and are younger  than 24 years.  You are older than 24 years, and your health care provider tells you that you are at risk for this type of infection.  Your sexual activity has changed since you were last screened, and you are at an increased risk for chlamydia or gonorrhea. Ask your health care provider if you are at risk.  If you are at risk of being infected with HIV, it is recommended that you take a prescription medicine daily to prevent HIV infection. This is called pre-exposure prophylaxis (PrEP). You are considered at risk if:  You are a man who has sex with other men (MSM).  You are a heterosexual man who is sexually active with multiple partners.  You take drugs by injection.  You are sexually active with a partner who has HIV.  Talk with your health care provider about whether you are at high risk of being infected with HIV. If you choose to begin PrEP, you should first be tested for HIV. You should then be tested every 3 months for as long as you are taking PrEP.  Use sunscreen. Apply sunscreen liberally and repeatedly throughout the day. You should seek shade when your shadow is shorter than you. Protect yourself by wearing long sleeves, pants, a wide-brimmed hat, and sunglasses year round whenever you are outdoors.  Tell your health care provider of new moles or changes in moles, especially if there is a change in shape or color. Also, tell your health care provider if a mole is larger than the size of a pencil eraser.  A one-time screening for abdominal aortic aneurysm (AAA) and surgical repair of large AAAs by ultrasound is recommended for men aged 65-75 years who are  current or former smokers.  Stay current with your vaccines (immunizations).   This information is not intended to replace advice given to you by your health care provider. Make sure you discuss any questions you have with your health care provider.   Document Released: 07/16/2007 Document Revised: 02/07/2014 Document Reviewed: 06/14/2010 Elsevier Interactive Patient Education [employer]

## 2015-09-08 ENCOUNTER — Other Ambulatory Visit: Payer: Self-pay | Admitting: Internal Medicine

## 2015-09-08 ENCOUNTER — Other Ambulatory Visit: Payer: Self-pay | Admitting: *Deleted

## 2015-09-08 MED ORDER — HYDROCHLOROTHIAZIDE 25 MG PO TABS: ORAL_TABLET | ORAL | 3 refills | Status: DC

## 2015-09-08 MED ORDER — SIMVASTATIN 40 MG PO TABS: ORAL_TABLET | ORAL | 3 refills | Status: DC

## 2016-08-06 DIAGNOSIS — R319 Hematuria, unspecified: Secondary | ICD-10-CM | POA: Diagnosis not present

## 2016-08-06 DIAGNOSIS — S29012A Strain of muscle and tendon of back wall of thorax, initial encounter: Secondary | ICD-10-CM | POA: Diagnosis not present

## 2016-08-06 DIAGNOSIS — R1010 Upper abdominal pain, unspecified: Secondary | ICD-10-CM | POA: Diagnosis not present

## 2016-09-02 ENCOUNTER — Encounter (HOSPITAL_COMMUNITY): Payer: Self-pay | Admitting: Emergency Medicine

## 2016-09-02 ENCOUNTER — Emergency Department (HOSPITAL_COMMUNITY): Payer: 59

## 2016-09-02 ENCOUNTER — Emergency Department (HOSPITAL_COMMUNITY)
Admission: EM | Admit: 2016-09-02 | Discharge: 2016-09-02 | Disposition: A | Discharge status: Home / Self Care | Payer: 59 | Attending: Emergency Medicine | Admitting: Emergency Medicine

## 2016-09-02 DIAGNOSIS — R1011 Right upper quadrant pain: Secondary | ICD-10-CM | POA: Diagnosis not present

## 2016-09-02 DIAGNOSIS — R1010 Upper abdominal pain, unspecified: Secondary | ICD-10-CM | POA: Diagnosis not present

## 2016-09-02 DIAGNOSIS — R109 Unspecified abdominal pain: Secondary | ICD-10-CM | POA: Diagnosis not present

## 2016-09-02 DIAGNOSIS — I10 Essential (primary) hypertension: Secondary | ICD-10-CM | POA: Diagnosis not present

## 2016-09-02 LAB — BASIC METABOLIC PANEL
Anion gap: 9 (ref 5–15)
BUN: 18 mg/dL (ref 6–20)
CALCIUM: 9.3 mg/dL (ref 8.9–10.3)
CHLORIDE: 107 mmol/L (ref 101–111)
CO2: 25 mmol/L (ref 22–32)
CREATININE: 1.43 mg/dL — AB (ref 0.61–1.24)
GFR calc non Af Amer: 57 mL/min — ABNORMAL LOW (ref >60)
Glucose, Bld: 181 mg/dL — ABNORMAL HIGH (ref 65–99)
Potassium: 3.3 mmol/L — ABNORMAL LOW (ref 3.5–5.1)
SODIUM: 141 mmol/L (ref 135–145)

## 2016-09-02 LAB — CBC
HCT: 39.2 % (ref 39.0–52.0)
Hemoglobin: 13.7 g/dL (ref 13.0–17.0)
MCH: 32.5 pg (ref 26.0–34.0)
MCHC: 34.9 g/dL (ref 30.0–36.0)
MCV: 92.9 fL (ref 78.0–100.0)
PLATELETS: 263 10*3/uL (ref 150–400)
RBC: 4.22 MIL/uL (ref 4.22–5.81)
RDW: 12.8 % (ref 11.5–15.5)
WBC: 7.5 10*3/uL (ref 4.0–10.5)

## 2016-09-02 LAB — URINALYSIS, ROUTINE W REFLEX MICROSCOPIC
BILIRUBIN URINE: NEGATIVE
Glucose, UA: NEGATIVE mg/dL
KETONES UR: 5 mg/dL — AB
LEUKOCYTES UA: NEGATIVE
Nitrite: NEGATIVE
Protein, ur: 30 mg/dL — AB
SPECIFIC GRAVITY, URINE: 1.03 (ref 1.005–1.030)
SQUAMOUS EPITHELIAL / LPF: NONE SEEN
pH: 5 (ref 5.0–8.0)

## 2016-09-02 MED ORDER — IBUPROFEN 800 MG PO TABS: 800.0000 mg | ORAL_TABLET | Freq: Three times a day (TID) | ORAL | 0 refills | Status: DC | PRN

## 2016-09-02 MED ORDER — IBUPROFEN 800 MG PO TABS
800.0000 mg | ORAL_TABLET | Freq: Once | ORAL | Status: AC
  Administered 2016-09-02: 800 mg via ORAL
  Filled 2016-09-02: qty 1

## 2016-09-02 MED ORDER — HYDROCODONE-ACETAMINOPHEN 5-325 MG PO TABS: 1.0000 | ORAL_TABLET | Freq: Four times a day (QID) | ORAL | 0 refills | Status: DC | PRN

## 2016-09-02 MED ORDER — HYDROCODONE-ACETAMINOPHEN 5-325 MG PO TABS
2.0000 | ORAL_TABLET | Freq: Once | ORAL | Status: AC
Start: 2016-09-02 — End: 2016-09-02
  Administered 2016-09-02: 2 via ORAL
  Filled 2016-09-02: qty 2

## 2016-09-02 NOTE — ED Triage Notes (Signed)
Pt c/o right sided flank pain that has been going on for a few weeks, tonight pain increased. Pt seen at urgent care recently for same problem, states he was told there was trace amounts of blood in urine. Denies injury.

## 2016-09-02 NOTE — ED Notes (Signed)
Pt taken to CT.
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
## 2016-09-06 ENCOUNTER — Ambulatory Visit (INDEPENDENT_AMBULATORY_CARE_PROVIDER_SITE_OTHER): Payer: 59 | Admitting: Nurse Practitioner

## 2016-09-06 ENCOUNTER — Encounter: Payer: Self-pay | Admitting: Nurse Practitioner

## 2016-09-06 DIAGNOSIS — M25561 Pain in right knee: Secondary | ICD-10-CM | POA: Diagnosis not present

## 2016-09-06 DIAGNOSIS — M549 Dorsalgia, unspecified: Secondary | ICD-10-CM

## 2016-09-06 MED ORDER — METHOCARBAMOL 500 MG PO TABS: 500.0000 mg | ORAL_TABLET | Freq: Three times a day (TID) | ORAL | 0 refills | Status: DC | PRN
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
## 2016-09-12 ENCOUNTER — Other Ambulatory Visit (INDEPENDENT_AMBULATORY_CARE_PROVIDER_SITE_OTHER): Payer: 59

## 2016-09-12 ENCOUNTER — Ambulatory Visit (INDEPENDENT_AMBULATORY_CARE_PROVIDER_SITE_OTHER): Payer: 59 | Admitting: Nurse Practitioner

## 2016-09-12 ENCOUNTER — Encounter: Payer: Self-pay | Admitting: Nurse Practitioner

## 2016-09-12 VITALS — BP 132/78 | HR 71 | Temp 98.5°F | Ht 73.0 in | Wt 246.0 lb

## 2016-09-12 DIAGNOSIS — I10 Essential (primary) hypertension: Secondary | ICD-10-CM | POA: Diagnosis not present

## 2016-09-12 DIAGNOSIS — Z0000 Encounter for general adult medical examination without abnormal findings: Secondary | ICD-10-CM | POA: Diagnosis not present

## 2016-09-12 DIAGNOSIS — Z125 Encounter for screening for malignant neoplasm of prostate: Secondary | ICD-10-CM | POA: Diagnosis not present

## 2016-09-12 DIAGNOSIS — R739 Hyperglycemia, unspecified: Secondary | ICD-10-CM | POA: Diagnosis not present

## 2016-09-12 DIAGNOSIS — E782 Mixed hyperlipidemia: Secondary | ICD-10-CM

## 2016-09-12 DIAGNOSIS — R319 Hematuria, unspecified: Secondary | ICD-10-CM

## 2016-09-12 LAB — COMPREHENSIVE METABOLIC PANEL
ALK PHOS: 87 U/L (ref 39–117)
ALT: 33 U/L (ref 0–53)
AST: 20 U/L (ref 0–37)
Albumin: 4.6 g/dL (ref 3.5–5.2)
BUN: 12 mg/dL (ref 6–23)
CO2: 30 mEq/L (ref 19–32)
Calcium: 9.3 mg/dL (ref 8.4–10.5)
Chloride: 102 mEq/L (ref 96–112)
Creatinine, Ser: 1.09 mg/dL (ref 0.40–1.50)
GFR: 92.47 mL/min (ref >60.00)
GLUCOSE: 93 mg/dL (ref 70–99)
POTASSIUM: 3.8 meq/L (ref 3.5–5.1)
Sodium: 138 mEq/L (ref 135–145)
TOTAL PROTEIN: 6.5 g/dL (ref 6.0–8.3)
Total Bilirubin: 0.8 mg/dL (ref 0.2–1.2)

## 2016-09-12 LAB — URINALYSIS WITH CULTURE, IF INDICATED
BILIRUBIN URINE: NEGATIVE
KETONES UR: NEGATIVE
Leukocytes, UA: NEGATIVE
NITRITE: NEGATIVE
Specific Gravity, Urine: 1.01 (ref 1.000–1.030)
Total Protein, Urine: NEGATIVE
URINE GLUCOSE: NEGATIVE
Urobilinogen, UA: 0.2 (ref 0.0–1.0)
pH: 6 (ref 5.0–8.0)

## 2016-09-12 LAB — LIPID PANEL
CHOLESTEROL: 164 mg/dL (ref 0–200)
HDL: 45 mg/dL (ref >39.00)
LDL CALC: 101 mg/dL — AB (ref 0–99)
NonHDL: 119.43
TRIGLYCERIDES: 90 mg/dL (ref 0.0–149.0)
Total CHOL/HDL Ratio: 4
VLDL: 18 mg/dL (ref 0.0–40.0)

## 2016-09-12 LAB — PSA: PSA: 1.16 ng/mL (ref 0.10–4.00)

## 2016-09-12 LAB — TSH: TSH: 1.14 u[IU]/mL (ref 0.35–4.50)

## 2016-09-12 LAB — HEMOGLOBIN A1C: Hgb A1c MFr Bld: 5.3 % (ref 4.6–6.5)

## 2016-09-12 MED ORDER — SIMVASTATIN 40 MG PO TABS: ORAL_TABLET | ORAL | 3 refills | Status: DC

## 2016-09-12 MED ORDER — HYDROCHLOROTHIAZIDE 25 MG PO TABS: ORAL_TABLET | ORAL | 3 refills | Status: DC
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
## 2016-09-12 NOTE — Patient Instructions (Addendum)
Decreased amount of blood in urine. Continue to monitor at this time. Consider urology consult in future if develops any urinary symptoms.  Stable TSH, PSA, lipid panel, hgbA1c, and CMP. Continue current medications.  Medical Screening Exam A medical screening exam helps determine whether or not you need emergency medical treatment. During the medical screening exam, a health care provider does a short physical exam and medical history to assess:  Your current symptoms.  Your overall health.  Depending on your symptoms, you may need additional tests. What are the possible outcomes of a medical screening exam? Your medical screening exam may determine that:  You do not need emergency treatment at this time.  You need treatment right away.  You need to be transferred to another medical center.  When should I seek medical care? If you have a regular health care provider, make an appointment for a follow-up visit with him or her. If you do not have a regular health care provider, ask about resources in your community. Get help right away if: Your condition may change over time. If your condition gets worse or you develop new or troubling symptoms before you see your health care provider, go to an emergency department right away. In an emergency:  Call 911 or have someone drive you to the nearest hospital.  Do not drive yourself. This information is not intended to replace advice given to you by your health care provider. Make sure you discuss any questions you have with your health care provider. Document Released: 02/25/2004 Document Revised: 09/29/2015 Document Reviewed: 10/30/2014 Elsevier Interactive Patient Education  [employer]2018 Elsevier Inc.

## 2016-09-20 ENCOUNTER — Other Ambulatory Visit: Payer: Self-pay | Admitting: Internal Medicine

## 2016-09-20 DIAGNOSIS — I10 Essential (primary) hypertension: Secondary | ICD-10-CM

## 2016-09-20 DIAGNOSIS — E782 Mixed hyperlipidemia: Secondary | ICD-10-CM

## 2016-10-31 ENCOUNTER — Telehealth: Payer: Self-pay | Admitting: *Deleted

## 2016-10-31 DIAGNOSIS — E782 Mixed hyperlipidemia: Secondary | ICD-10-CM

## 2016-10-31 DIAGNOSIS — I10 Essential (primary) hypertension: Secondary | ICD-10-CM

## 2016-10-31 MED ORDER — HYDROCHLOROTHIAZIDE 25 MG PO TABS: ORAL_TABLET | ORAL | 1 refills | Status: DC

## 2016-10-31 MED ORDER — SIMVASTATIN 40 MG PO TABS: ORAL_TABLET | ORAL | 1 refills | Status: DC

## 2016-10-31 NOTE — Telephone Encounter (Signed)
Left msg on triage stating he need refills on his BP med and cholesterol medications. Saw Charlotte back in August fro annual appt since MD was out of office. Review chart he saw NP on 8/.13, sent updated script to [facility]...Raechel Chute

## 2017-03-13 DIAGNOSIS — R6889 Other general symptoms and signs: Secondary | ICD-10-CM | POA: Diagnosis not present

## 2017-03-26 ENCOUNTER — Other Ambulatory Visit: Payer: Self-pay | Admitting: Internal Medicine

## 2017-03-26 DIAGNOSIS — E782 Mixed hyperlipidemia: Secondary | ICD-10-CM

## 2017-05-06 ENCOUNTER — Other Ambulatory Visit: Payer: Self-pay | Admitting: Internal Medicine

## 2017-05-06 DIAGNOSIS — I10 Essential (primary) hypertension: Secondary | ICD-10-CM

## 2017-07-18 ENCOUNTER — Ambulatory Visit: Payer: 59 | Admitting: Internal Medicine

## 2017-09-18 ENCOUNTER — Other Ambulatory Visit (INDEPENDENT_AMBULATORY_CARE_PROVIDER_SITE_OTHER): Payer: 59

## 2017-09-18 ENCOUNTER — Encounter: Payer: Self-pay | Admitting: Internal Medicine

## 2017-09-18 ENCOUNTER — Ambulatory Visit (INDEPENDENT_AMBULATORY_CARE_PROVIDER_SITE_OTHER): Payer: 59 | Admitting: Internal Medicine

## 2017-09-18 VITALS — BP 128/82 | HR 69 | Temp 98.5°F | Ht 73.0 in | Wt 245.0 lb

## 2017-09-18 DIAGNOSIS — Z0000 Encounter for general adult medical examination without abnormal findings: Secondary | ICD-10-CM

## 2017-09-18 DIAGNOSIS — I10 Essential (primary) hypertension: Secondary | ICD-10-CM

## 2017-09-18 DIAGNOSIS — E669 Obesity, unspecified: Secondary | ICD-10-CM

## 2017-09-18 DIAGNOSIS — E782 Mixed hyperlipidemia: Secondary | ICD-10-CM | POA: Diagnosis not present

## 2017-09-18 LAB — COMPREHENSIVE METABOLIC PANEL
ALT: 34 U/L (ref 0–53)
AST: 20 U/L (ref 0–37)
Albumin: 4.3 g/dL (ref 3.5–5.2)
Alkaline Phosphatase: 89 U/L (ref 39–117)
BUN: 19 mg/dL (ref 6–23)
CALCIUM: 9.4 mg/dL (ref 8.4–10.5)
CHLORIDE: 106 meq/L (ref 96–112)
CO2: 25 meq/L (ref 19–32)
Creatinine, Ser: 1.23 mg/dL (ref 0.40–1.50)
GFR: 80.1 mL/min (ref >60.00)
Glucose, Bld: 111 mg/dL — ABNORMAL HIGH (ref 70–99)
Potassium: 3.6 mEq/L (ref 3.5–5.1)
SODIUM: 141 meq/L (ref 135–145)
Total Bilirubin: 1 mg/dL (ref 0.2–1.2)
Total Protein: 6.9 g/dL (ref 6.0–8.3)

## 2017-09-18 LAB — LIPID PANEL
CHOL/HDL RATIO: 4
Cholesterol: 188 mg/dL (ref 0–200)
HDL: 43.6 mg/dL (ref >39.00)
LDL CALC: 116 mg/dL — AB (ref 0–99)
NonHDL: 144.03
Triglycerides: 140 mg/dL (ref 0.0–149.0)
VLDL: 28 mg/dL (ref 0.0–40.0)

## 2017-09-18 LAB — CBC
HCT: 39.9 % (ref 39.0–52.0)
Hemoglobin: 13.6 g/dL (ref 13.0–17.0)
MCHC: 34.1 g/dL (ref 30.0–36.0)
MCV: 96.2 fl (ref 78.0–100.0)
Platelets: 249 10*3/uL (ref 150.0–400.0)
RBC: 4.14 Mil/uL — ABNORMAL LOW (ref 4.22–5.81)
RDW: 13.2 % (ref 11.5–15.5)
WBC: 5.4 10*3/uL (ref 4.0–10.5)

## 2017-09-18 LAB — PSA: PSA: 1.25 ng/mL (ref 0.10–4.00)

## 2017-09-18 LAB — HEMOGLOBIN A1C: Hgb A1c MFr Bld: 5.4 % (ref 4.6–6.5)

## 2017-09-18 NOTE — Assessment & Plan Note (Signed)
Stable BMI, encouraged more exercise.

## 2017-09-18 NOTE — Patient Instructions (Addendum)
Turmeric is something that might help with the stiffness.   Think about the shingles shot shignrix. This will help you protect against shingles by decreasing your risk from 30% to 3%. Call us or send a mychart if you want to get this.  For the colon screening you can do colonoscopy or cologuard so let us know which you you would like to do.    Health Maintenance, Male A healthy lifestyle and preventive care is important for your health and wellness. Ask your health care provider about what schedule of regular examinations is right for you. What should I know about weight and diet? Eat a Healthy Diet  Eat plenty of vegetables, fruits, whole grains, low-fat dairy products, and lean protein.  Do not eat a lot of foods high in solid fats, added sugars, or salt.  Maintain a Healthy Weight Regular exercise can help you achieve or maintain a healthy weight. You should:  Do at least 150 minutes of exercise each week. The exercise should increase your heart rate and make you sweat (moderate-intensity exercise).  Do strength-training exercises at least twice a week.  Watch Your Levels of Cholesterol and Blood Lipids  Have your blood tested for lipids and cholesterol every 5 years starting at 50 years of age. If you are at high risk for heart disease, you should start having your blood tested when you are 50 years old. You may need to have your cholesterol levels checked more often if: ? Your lipid or cholesterol levels are high. ? You are older than 50 years of age. ? You are at high risk for heart disease.  What should I know about cancer screening? Many types of cancers can be detected early and may often be prevented. Lung Cancer  You should be screened every year for lung cancer if: ? You are a current smoker who has smoked for at least 30 years. ? You are a former smoker who has quit within the past 15 years.  Talk to your health care provider about your screening options, when you  should start screening, and how often you should be screened.  Colorectal Cancer  Routine colorectal cancer screening usually begins at 50 years of age and should be repeated every 5-10 years until you are 50 years old. You may need to be screened more often if early forms of precancerous polyps or small growths are found. Your health care provider may recommend screening at an earlier age if you have risk factors for colon cancer.  Your health care provider may recommend using home test kits to check for hidden blood in the stool.  A small camera at the end of a tube can be used to examine your colon (sigmoidoscopy or colonoscopy). This checks for the earliest forms of colorectal cancer.  Prostate and Testicular Cancer  Depending on your age and overall health, your health care provider may do certain tests to screen for prostate and testicular cancer.  Talk to your health care provider about any symptoms or concerns you have about testicular or prostate cancer.  Skin Cancer  Check your skin from head to toe regularly.  Tell your health care provider about any new moles or changes in moles, especially if: ? There is a change in a mole's size, shape, or color. ? You have a mole that is larger than a pencil eraser.  Always use sunscreen. Apply sunscreen liberally and repeat throughout the day.  Protect yourself by wearing long sleeves, pants, a wide-brimmed  hat, and sunglasses when outside.  What should I know about heart disease, diabetes, and high blood pressure?  If you are 31-45 years of age, have your blood pressure checked every 3-5 years. If you are 87 years of age or older, have your blood pressure checked every year. You should have your blood pressure measured twice-once when you are at a hospital or clinic, and once when you are not at a hospital or clinic. Record the average of the two measurements. To check your blood pressure when you are not at a hospital or clinic, you  can use: ? An automated blood pressure machine at a pharmacy. ? A home blood pressure monitor.  Talk to your health care provider about your target blood pressure.  If you are between 70-41 years old, ask your health care provider if you should take aspirin to prevent heart disease.  Have regular diabetes screenings by checking your fasting blood sugar level. ? If you are at a normal weight and have a low risk for diabetes, have this test once every three years after the age of 33. ? If you are overweight and have a high risk for diabetes, consider being tested at a younger age or more often.  A one-time screening for abdominal aortic aneurysm (AAA) by ultrasound is recommended for men aged 71-75 years who are current or former smokers. What should I know about preventing infection? Hepatitis B If you have a higher risk for hepatitis B, you should be screened for this virus. Talk with your health care provider to find out if you are at risk for hepatitis B infection. Hepatitis C Blood testing is recommended for:  Everyone born from [birth year] through 1963.  Anyone with known risk factors for hepatitis C.  Sexually Transmitted Diseases (STDs)  You should be screened each year for STDs including gonorrhea and chlamydia if: ? You are sexually active and are younger than 50 years of age. ? You are older than 50 years of age and your health care provider tells you that you are at risk for this type of infection. ? Your sexual activity has changed since you were last screened and you are at an increased risk for chlamydia or gonorrhea. Ask your health care provider if you are at risk.  Talk with your health care provider about whether you are at high risk of being infected with HIV. Your health care provider may recommend a prescription medicine to help prevent HIV infection.  What else can I do?  Schedule regular health, dental, and eye exams.  Stay current with your vaccines  (immunizations).  Do not use any tobacco products, such as cigarettes, chewing tobacco, and e-cigarettes. If you need help quitting, ask your health care provider.  Limit alcohol intake to no more than 2 drinks per day. One drink equals 12 ounces of beer, 5 ounces of wine, or 1 ounces of hard liquor.  Do not use street drugs.  Do not share needles.  Ask your health care provider for help if you need support or information about quitting drugs.  Tell your health care provider if you often feel depressed.  Tell your health care provider if you have ever been abused or do not feel safe at home. This information is not intended to replace advice given to you by your health care provider. Make sure you discuss any questions you have with your health care provider. Document Released: 07/16/2007 Document Revised: 09/16/2015 Document Reviewed: 10/21/2014 Elsevier Interactive Patient Education  2018 Elsevier Inc.  

## 2017-09-18 NOTE — Assessment & Plan Note (Signed)
Taking hctz and BP at goal. Checking CMP and adjust as needed.  

## 2017-09-18 NOTE — Assessment & Plan Note (Signed)
Flu shot yearly. Shingrix counseled. Tetanus up to date. Colonoscopy counseled, he will let us know colonoscopy or cologuard. Counseled about sun safety and mole surveillance. Counseled about the dangers of distracted driving. Given 10 year screening recommendations.

## 2017-09-18 NOTE — Assessment & Plan Note (Signed)
Taking simvastatin 40 mg daily and checking lipid panel. Adjust as needed.

## 2017-09-18 NOTE — Progress Notes (Signed)
   Subjective:    Patient ID: Theodore Whitney, male    DOB: 1965-06-29, 52 y.o.   MRN: [id number]  HPI The patient is a 52 YO man coming in for physical. No new concerns.  [facility], social history reviewed and updated.   Review of Systems  Constitutional: Negative.   HENT: Negative.   Eyes: Negative.   Respiratory: Negative for cough, chest tightness and shortness of breath.   Cardiovascular: Negative for chest pain, palpitations and leg swelling.  Gastrointestinal: Negative for abdominal distention, abdominal pain, constipation, diarrhea, nausea and vomiting.  Musculoskeletal: Negative.   Skin: Negative.   Neurological: Negative.   Psychiatric/Behavioral: Negative.       Objective:   Physical Exam  Constitutional: He is oriented to person, place, and time. He appears well-developed and well-nourished.  HENT:  Head: Normocephalic and atraumatic.  Eyes: EOM are normal.  Neck: Normal range of motion.  Cardiovascular: Normal rate and regular rhythm.  Pulmonary/Chest: Effort normal and breath sounds normal. No respiratory distress. He has no wheezes. He has no rales.  Abdominal: Soft. Bowel sounds are normal. He exhibits no distension. There is no tenderness. There is no rebound.  Musculoskeletal: He exhibits no edema.  Neurological: He is alert and oriented to person, place, and time. Coordination normal.  Skin: Skin is warm and dry.  Psychiatric: He has a normal mood and affect.   Vitals:   09/18/17 0845  BP: 128/82  Pulse: 69  Temp: 98.5 F (36.9 C)  TempSrc: Oral  SpO2: 97%  Weight: 245 lb (111.1 kg)  Height: 6\' 1"  (1.854 m)      Assessment & Plan:

## 2017-11-10 ENCOUNTER — Other Ambulatory Visit: Payer: Self-pay | Admitting: Internal Medicine

## 2017-11-10 ENCOUNTER — Encounter: Payer: Self-pay | Admitting: Internal Medicine

## 2017-11-10 ENCOUNTER — Ambulatory Visit (INDEPENDENT_AMBULATORY_CARE_PROVIDER_SITE_OTHER): Payer: 59 | Admitting: Internal Medicine

## 2017-11-10 ENCOUNTER — Telehealth: Payer: Self-pay

## 2017-11-10 VITALS — BP 140/82 | HR 97 | Temp 99.1°F | Ht 73.0 in | Wt 239.0 lb

## 2017-11-10 DIAGNOSIS — R0789 Other chest pain: Secondary | ICD-10-CM

## 2017-11-10 DIAGNOSIS — E782 Mixed hyperlipidemia: Secondary | ICD-10-CM

## 2017-11-10 DIAGNOSIS — I10 Essential (primary) hypertension: Secondary | ICD-10-CM

## 2017-11-10 DIAGNOSIS — Z1211 Encounter for screening for malignant neoplasm of colon: Secondary | ICD-10-CM

## 2017-11-10 DIAGNOSIS — K219 Gastro-esophageal reflux disease without esophagitis: Secondary | ICD-10-CM | POA: Insufficient documentation

## 2017-11-10 MED ORDER — PANTOPRAZOLE SODIUM 40 MG PO TBEC: 40.0000 mg | DELAYED_RELEASE_TABLET | Freq: Every day | ORAL | 3 refills | Status: DC

## 2017-11-10 NOTE — Patient Instructions (Addendum)
The EKG of the heart is normal. This is likely heartburn and we have sent in protonix to take daily for 2 weeks then try stopping.   Food Choices for Gastroesophageal Reflux Disease, Adult When you have gastroesophageal reflux disease (GERD), the foods you eat and your eating habits are very important. Choosing the right foods can help ease your discomfort. What guidelines do I need to follow?  Choose fruits, vegetables, whole grains, and low-fat dairy products.  Choose low-fat meat, fish, and poultry.  Limit fats such as oils, salad dressings, butter, nuts, and avocado.  Keep a food diary. This helps you identify foods that cause symptoms.  Avoid foods that cause symptoms. These may be different for everyone.  Eat small meals often instead of 3 large meals a day.  Eat your meals slowly, in a place where you are relaxed.  Limit fried foods.  Cook foods using methods other than frying.  Avoid drinking alcohol.  Avoid drinking large amounts of liquids with your meals.  Avoid bending over or lying down until 2-3 hours after eating. What foods are not recommended? These are some foods and drinks that may make your symptoms worse: Vegetables Tomatoes. Tomato juice. Tomato and spaghetti sauce. Chili peppers. Onion and garlic. Horseradish. Fruits Oranges, grapefruit, and lemon (fruit and juice). Meats High-fat meats, fish, and poultry. This includes hot dogs, ribs, ham, sausage, salami, and bacon. Dairy Whole milk and chocolate milk. Sour cream. Cream. Butter. Ice cream. Cream cheese. Drinks Coffee and tea. Bubbly (carbonated) drinks or energy drinks. Condiments Hot sauce. Barbecue sauce. Sweets/Desserts Chocolate and cocoa. Donuts. Peppermint and spearmint. Fats and Oils High-fat foods. This includes [place] fries and potato chips. Other Vinegar. Strong spices. This includes black pepper, white pepper, red pepper, cayenne, curry powder, cloves, ginger, and chili powder. The  items listed above may not be a complete list of foods and drinks to avoid. Contact your dietitian for more information. This information is not intended to replace advice given to you by your health care provider. Make sure you discuss any questions you have with your health care provider. Document Released: 07/19/2011 Document Revised: 06/25/2015 Document Reviewed: 11/21/2012 Elsevier Interactive Patient Education  2017 [employer].

## 2017-11-10 NOTE — Assessment & Plan Note (Signed)
EKG done without changes. Suspect GERD. Rx for protonix to try for 2-3 weeks and then stop if able. Given information about dietary changes to avoid GERD.

## 2017-11-10 NOTE — Telephone Encounter (Signed)
Copied from CRM [id number]. Topic: General - Other >> Nov 10, 2017  3:25 PM Gaynelle Adu wrote: Reason for CRM: Patient is calling to request a home cologuard. Please advise >> Nov 10, 2017  4:46 PM Marquis Buggy A wrote: LOV 08/2017 for a CPE.

## 2017-11-10 NOTE — Progress Notes (Signed)
   Subjective:    Patient ID: Theodore Whitney, male    DOB: 1965/04/12, 52 y.o.   MRN: [id number]  HPI The patient is a 52 YO man coming in for chest pressure and belching. This started about 1-2 days ago. Denies change in diet or caffeine. Denies cough or SOB. Some pressure in his upper stomach and left chest. Denies anything seeming to trigger it. It comes and goes randomly and leaves within about 30 minutes or so. Tried tums which he is not sure if it works he cannot recall.   Review of Systems  Constitutional: Negative.   HENT: Negative.   Eyes: Negative.   Respiratory: Positive for chest tightness. Negative for cough and shortness of breath.   Cardiovascular: Negative for chest pain, palpitations and leg swelling.  Gastrointestinal: Positive for abdominal pain. Negative for abdominal distention, constipation, diarrhea, nausea and vomiting.       Belching  Musculoskeletal: Negative.   Skin: Negative.   Neurological: Negative.       Objective:   Physical Exam  Constitutional: He is oriented to person, place, and time. He appears well-developed and well-nourished.  HENT:  Head: Normocephalic and atraumatic.  Eyes: EOM are normal.  Neck: Normal range of motion.  Cardiovascular: Normal rate and regular rhythm.  Pulmonary/Chest: Effort normal and breath sounds normal. No respiratory distress. He has no wheezes. He has no rales.  Abdominal: Soft. Bowel sounds are normal. He exhibits no distension. There is no tenderness. There is no rebound.  Musculoskeletal: He exhibits no edema.  Neurological: He is alert and oriented to person, place, and time. Coordination normal.  Skin: Skin is warm and dry.  Psychiatric: He has a normal mood and affect.   Vitals:   11/10/17 1442  BP: 140/82  Pulse: 97  Temp: 99.1 F (37.3 C)  TempSrc: Oral  SpO2: 98%  Weight: 239 lb (108.4 kg)  Height: 6\' 1"  (1.854 m)   EKG: Rate 74, axis normal. Interval normal, no st or t wave changes. No prior to  compare.     Assessment & Plan:

## 2017-11-13 LAB — COLOGUARD: Cologuard: NEGATIVE

## 2017-11-13 NOTE — Telephone Encounter (Signed)
cologuard ordered.

## 2017-11-13 NOTE — Addendum Note (Signed)
Addended by: Berton Lan R on: 11/13/2017 10:50 AM   Modules accepted: Orders

## 2017-11-13 NOTE — Telephone Encounter (Signed)
Ok to order 

## 2017-11-21 ENCOUNTER — Ambulatory Visit: Payer: 59

## 2017-11-24 ENCOUNTER — Ambulatory Visit (INDEPENDENT_AMBULATORY_CARE_PROVIDER_SITE_OTHER): Payer: 59 | Admitting: *Deleted

## 2017-11-24 DIAGNOSIS — Z23 Encounter for immunization: Secondary | ICD-10-CM

## 2017-11-29 DIAGNOSIS — Z1212 Encounter for screening for malignant neoplasm of rectum: Secondary | ICD-10-CM | POA: Diagnosis not present

## 2017-11-29 DIAGNOSIS — Z1211 Encounter for screening for malignant neoplasm of colon: Secondary | ICD-10-CM | POA: Diagnosis not present

## 2017-12-07 ENCOUNTER — Encounter: Payer: Self-pay | Admitting: Internal Medicine

## 2017-12-07 NOTE — Progress Notes (Signed)
Abstracted and sent to scan  

## 2018-04-29 ENCOUNTER — Other Ambulatory Visit: Payer: Self-pay | Admitting: Internal Medicine

## 2018-04-29 DIAGNOSIS — E782 Mixed hyperlipidemia: Secondary | ICD-10-CM

## 2018-04-29 DIAGNOSIS — I10 Essential (primary) hypertension: Secondary | ICD-10-CM

## 2018-07-10 ENCOUNTER — Other Ambulatory Visit: Payer: Self-pay | Admitting: *Deleted

## 2018-07-10 DIAGNOSIS — Z20822 Contact with and (suspected) exposure to covid-19: Secondary | ICD-10-CM

## 2018-07-12 LAB — NOVEL CORONAVIRUS, NAA: SARS-CoV-2, NAA: NOT DETECTED

## 2018-09-26 ENCOUNTER — Ambulatory Visit (INDEPENDENT_AMBULATORY_CARE_PROVIDER_SITE_OTHER): Payer: 59 | Admitting: Internal Medicine

## 2018-09-26 ENCOUNTER — Other Ambulatory Visit (INDEPENDENT_AMBULATORY_CARE_PROVIDER_SITE_OTHER): Payer: 59

## 2018-09-26 ENCOUNTER — Encounter: Payer: Self-pay | Admitting: Internal Medicine

## 2018-09-26 ENCOUNTER — Other Ambulatory Visit: Payer: Self-pay

## 2018-09-26 VITALS — BP 130/84 | HR 73 | Temp 98.9°F | Ht 73.0 in | Wt 243.0 lb

## 2018-09-26 DIAGNOSIS — Z23 Encounter for immunization: Secondary | ICD-10-CM | POA: Diagnosis not present

## 2018-09-26 DIAGNOSIS — I10 Essential (primary) hypertension: Secondary | ICD-10-CM | POA: Diagnosis not present

## 2018-09-26 DIAGNOSIS — E782 Mixed hyperlipidemia: Secondary | ICD-10-CM

## 2018-09-26 DIAGNOSIS — Z0000 Encounter for general adult medical examination without abnormal findings: Secondary | ICD-10-CM

## 2018-09-26 DIAGNOSIS — E669 Obesity, unspecified: Secondary | ICD-10-CM

## 2018-09-26 DIAGNOSIS — K219 Gastro-esophageal reflux disease without esophagitis: Secondary | ICD-10-CM

## 2018-09-26 LAB — LIPID PANEL
Cholesterol: 151 mg/dL (ref 0–200)
HDL: 38.6 mg/dL — ABNORMAL LOW (ref >39.00)
LDL Cholesterol: 90 mg/dL (ref 0–99)
NonHDL: 112.85
Total CHOL/HDL Ratio: 4
Triglycerides: 113 mg/dL (ref 0.0–149.0)
VLDL: 22.6 mg/dL (ref 0.0–40.0)

## 2018-09-26 LAB — COMPREHENSIVE METABOLIC PANEL
ALT: 28 U/L (ref 0–53)
AST: 18 U/L (ref 0–37)
Albumin: 4.5 g/dL (ref 3.5–5.2)
Alkaline Phosphatase: 83 U/L (ref 39–117)
BUN: 18 mg/dL (ref 6–23)
CO2: 28 mEq/L (ref 19–32)
Calcium: 9.4 mg/dL (ref 8.4–10.5)
Chloride: 106 mEq/L (ref 96–112)
Creatinine, Ser: 1.18 mg/dL (ref 0.40–1.50)
GFR: 78.74 mL/min (ref >60.00)
Glucose, Bld: 119 mg/dL — ABNORMAL HIGH (ref 70–99)
Potassium: 3.9 mEq/L (ref 3.5–5.1)
Sodium: 141 mEq/L (ref 135–145)
Total Bilirubin: 0.6 mg/dL (ref 0.2–1.2)
Total Protein: 7.2 g/dL (ref 6.0–8.3)

## 2018-09-26 LAB — CBC
HCT: 41.1 % (ref 39.0–52.0)
Hemoglobin: 13.9 g/dL (ref 13.0–17.0)
MCHC: 33.9 g/dL (ref 30.0–36.0)
MCV: 97.5 fl (ref 78.0–100.0)
Platelets: 244 10*3/uL (ref 150.0–400.0)
RBC: 4.21 Mil/uL — ABNORMAL LOW (ref 4.22–5.81)
RDW: 13.1 % (ref 11.5–15.5)
WBC: 5 10*3/uL (ref 4.0–10.5)

## 2018-09-26 LAB — HEMOGLOBIN A1C: Hgb A1c MFr Bld: 5.5 % (ref 4.6–6.5)

## 2018-09-26 MED ORDER — PANTOPRAZOLE SODIUM 40 MG PO TBEC: 40.0000 mg | DELAYED_RELEASE_TABLET | Freq: Every day | ORAL | 3 refills | Status: DC

## 2018-09-26 MED ORDER — HYDROCHLOROTHIAZIDE 25 MG PO TABS: 25.0000 mg | ORAL_TABLET | Freq: Every day | ORAL | 3 refills | Status: DC

## 2018-09-26 MED ORDER — SIMVASTATIN 40 MG PO TABS: 40.0000 mg | ORAL_TABLET | Freq: Every day | ORAL | 3 refills | Status: DC

## 2018-09-26 NOTE — Patient Instructions (Signed)
Health Maintenance, Male Adopting a healthy lifestyle and getting preventive care are important in promoting health and wellness. Ask your health care provider about:  The right schedule for you to have regular tests and exams.  Things you can do on your own to prevent diseases and keep yourself healthy. What should I know about diet, weight, and exercise? Eat a healthy diet   Eat a diet that includes plenty of vegetables, fruits, low-fat dairy products, and lean protein.  Do not eat a lot of foods that are high in solid fats, added sugars, or sodium. Maintain a healthy weight Body mass index (BMI) is a measurement that can be used to identify possible weight problems. It estimates body fat based on height and weight. Your health care provider can help determine your BMI and help you achieve or maintain a healthy weight. Get regular exercise Get regular exercise. This is one of the most important things you can do for your health. Most adults should:  Exercise for at least 150 minutes each week. The exercise should increase your heart rate and make you sweat (moderate-intensity exercise).  Do strengthening exercises at least twice a week. This is in addition to the moderate-intensity exercise.  Spend less time sitting. Even light physical activity can be beneficial. Watch cholesterol and blood lipids Have your blood tested for lipids and cholesterol at 51 years of age, then have this test every 5 years. You may need to have your cholesterol levels checked more often if:  Your lipid or cholesterol levels are high.  You are older than 51 years of age.  You are at high risk for heart disease. What should I know about cancer screening? Many types of cancers can be detected early and may often be prevented. Depending on your health history and family history, you may need to have cancer screening at various ages. This may include screening for:  Colorectal cancer.  Prostate cancer.   Skin cancer.  Lung cancer. What should I know about heart disease, diabetes, and high blood pressure? Blood pressure and heart disease  High blood pressure causes heart disease and increases the risk of stroke. This is more likely to develop in people who have high blood pressure readings, are of African descent, or are overweight.  Talk with your health care provider about your target blood pressure readings.  Have your blood pressure checked: ? Every 3-5 years if you are 79-12 years of age. ? Every year if you are 26 years old or older.  If you are between the ages of 57 and 39 and are a current or former smoker, ask your health care provider if you should have a one-time screening for abdominal aortic aneurysm (AAA). Diabetes Have regular diabetes screenings. This checks your fasting blood sugar level. Have the screening done:  Once every three years after age 15 if you are at a normal weight and have a low risk for diabetes.  More often and at a younger age if you are overweight or have a high risk for diabetes. What should I know about preventing infection? Hepatitis B If you have a higher risk for hepatitis B, you should be screened for this virus. Talk with your health care provider to find out if you are at risk for hepatitis B infection. Hepatitis C Blood testing is recommended for:  Everyone born from [birth year] through 1963.  Anyone with known risk factors for hepatitis C. Sexually transmitted infections (STIs)  You should be screened each year  for STIs, including gonorrhea and chlamydia, if: ? You are sexually active and are younger than 51 years of age. ? You are older than 51 years of age and your health care provider tells you that you are at risk for this type of infection. ? Your sexual activity has changed since you were last screened, and you are at increased risk for chlamydia or gonorrhea. Ask your health care provider if you are at risk.  Ask your health care  provider about whether you are at high risk for HIV. Your health care provider may recommend a prescription medicine to help prevent HIV infection. If you choose to take medicine to prevent HIV, you should first get tested for HIV. You should then be tested every 3 months for as long as you are taking the medicine. Follow these instructions at home: Lifestyle  Do not use any products that contain nicotine or tobacco, such as cigarettes, e-cigarettes, and chewing tobacco. If you need help quitting, ask your health care provider.  Do not use street drugs.  Do not share needles.  Ask your health care provider for help if you need support or information about quitting drugs. Alcohol use  Do not drink alcohol if your health care provider tells you not to drink.  If you drink alcohol: ? Limit how much you have to 0-2 drinks a day. ? Be aware of how much alcohol is in your drink. In the [place] one drink equals one 12 oz bottle of beer (355 mL), one 5 oz glass of wine (148 mL), or one 1 oz glass of hard liquor (44 mL). General instructions  Schedule regular health, dental, and eye exams.  Stay current with your vaccines.  Tell your health care provider if: ? You often feel depressed. ? You have ever been abused or do not feel safe at home. Summary  Adopting a healthy lifestyle and getting preventive care are important in promoting health and wellness.  Follow your health care provider's instructions about healthy diet, exercising, and getting tested or screened for diseases.  Follow your health care provider's instructions on monitoring your cholesterol and blood pressure. This information is not intended to replace advice given to you by your health care provider. Make sure you discuss any questions you have with your health care provider. Document Released: 07/16/2007 Document Revised: 01/10/2018 Document Reviewed: 01/10/2018 Elsevier Patient Education  2020 [employer]   Plantar  Fasciitis Rehab Ask your health care provider which exercises are safe for you. Do exercises exactly as told by your health care provider and adjust them as directed. It is normal to feel mild stretching, pulling, tightness, or discomfort as you do these exercises. Stop right away if you feel sudden pain or your pain gets worse. Do not begin these exercises until told by your health care provider. Stretching and range-of-motion exercises These exercises warm up your muscles and joints and improve the movement and flexibility of your foot. These exercises also help to relieve pain. Plantar fascia stretch  1. Sit with your left / right leg crossed over your opposite knee. 2. Hold your heel with one hand with that thumb near your arch. With your other hand, hold your toes and gently pull them back toward the top of your foot. You should feel a stretch on the bottom of your toes or your foot (plantar fascia) or both. 3. Hold this stretch for__________ seconds. 4. Slowly release your toes and return to the starting position. Repeat __________ times.  Complete this exercise __________ times a day. Gastrocnemius stretch, standing This exercise is also called a calf (gastroc) stretch. It stretches the muscles in the back of the upper calf. 1. Stand with your hands against a wall. 2. Extend your left / right leg behind you, and bend your front knee slightly. 3. Keeping your heels on the floor and your back knee straight, shift your weight toward the wall. Do not arch your back. You should feel a gentle stretch in your upper left / right calf. 4. Hold this position for __________ seconds. Repeat __________ times. Complete this exercise __________ times a day. Soleus stretch, standing This exercise is also called a calf (soleus) stretch. It stretches the muscles in the back of the lower calf. 1. Stand with your hands against a wall. 2. Extend your left / right leg behind you, and bend your front knee  slightly. 3. Keeping your heels on the floor, bend your back knee and shift your weight slightly over your back leg. You should feel a gentle stretch deep in your lower calf. 4. Hold this position for __________ seconds. Repeat __________ times. Complete this exercise __________ times a day. Gastroc and soleus stretch, standing step This exercise stretches the muscles in the back of the lower leg. These muscles are in the upper calf (gastrocnemius) and the lower calf (soleus). 1. Stand with the ball of your left / right foot on a step. The ball of your foot is on the walking surface, right under your toes. 2. Keep your other foot firmly on the same step. 3. Hold on to the wall or a railing for balance. 4. Slowly lift your other foot, allowing your body weight to press your left / right heel down over the edge of the step. You should feel a stretch in your left / right calf. 5. Hold this position for __________ seconds. 6. Return both feet to the step. 7. Repeat this exercise with a slight bend in your left / right knee. Repeat __________ times with your left / right knee straight and __________ times with your left / right knee bent. Complete this exercise __________ times a day. Balance exercise This exercise builds your balance and strength control of your arch to help take pressure off your plantar fascia. Single leg stand If this exercise is too easy, you can try it with your eyes closed or while standing on a pillow. 1. Without shoes, stand near a railing or in a doorway. You may hold on to the railing or door frame as needed. 2. Stand on your left / right foot. Keep your big toe down on the floor and try to keep your arch lifted. Do not let your foot roll inward. 3. Hold this position for __________ seconds. Repeat __________ times. Complete this exercise __________ times a day. This information is not intended to replace advice given to you by your health care provider. Make sure you  discuss any questions you have with your health care provider. Document Released: 01/17/2005 Document Revised: 05/10/2018 Document Reviewed: 11/15/2017 Elsevier Patient Education  2020 [employer].

## 2018-09-26 NOTE — Progress Notes (Signed)
   Subjective:   Patient ID: Theodore Whitney, male    DOB: May 14, 1965, 53 y.o.   MRN: [id number]  HPI The patient is a 53 YO man coming in for physical.   PMH, [place], social history reviewed and updated   Review of Systems  Constitutional: Negative.   HENT: Negative.   Eyes: Negative.   Respiratory: Negative for cough, chest tightness and shortness of breath.   Cardiovascular: Negative for chest pain, palpitations and leg swelling.  Gastrointestinal: Negative for abdominal distention, abdominal pain, constipation, diarrhea, nausea and vomiting.  Musculoskeletal: Negative.   Skin: Negative.   Neurological: Negative.   Psychiatric/Behavioral: Negative.     Objective:  Physical Exam Constitutional:      Appearance: He is well-developed.  HENT:     Head: Normocephalic and atraumatic.  Neck:     Musculoskeletal: Normal range of motion.  Cardiovascular:     Rate and Rhythm: Normal rate and regular rhythm.  Pulmonary:     Effort: Pulmonary effort is normal. No respiratory distress.     Breath sounds: Normal breath sounds. No wheezing or rales.  Abdominal:     General: Bowel sounds are normal. There is no distension.     Palpations: Abdomen is soft.     Tenderness: There is no abdominal tenderness. There is no rebound.  Skin:    General: Skin is warm and dry.  Neurological:     Mental Status: He is alert and oriented to person, place, and time.     Coordination: Coordination normal.     Vitals:   09/26/18 0918  BP: 130/84  Pulse: 73  Temp: 98.9 F (37.2 C)  TempSrc: Oral  SpO2: 99%  Weight: 243 lb (110.2 kg)  Height: 6\' 1"  (1.854 m)    Assessment & Plan:  Flu shot given at visit, Shingrix IM given at visit

## 2018-09-27 NOTE — Assessment & Plan Note (Signed)
Taking protonix which is working well. Refill.

## 2018-09-27 NOTE — Assessment & Plan Note (Signed)
Checking lipid panel and adjust simvastatin 40 mg daily as needed.  

## 2018-09-27 NOTE — Assessment & Plan Note (Signed)
Flu shot given. Shingrix given 1st today. Tetanus up to date. Cologuard up to date. Counseled about sun safety and mole surveillance. Counseled about the dangers of distracted driving. Given 10 year screening recommendations.

## 2018-09-27 NOTE — Assessment & Plan Note (Signed)
BP at goal on hctz 25 mg daily. Checking CMP and adjust as needed.  

## 2018-09-27 NOTE — Assessment & Plan Note (Signed)
Weight is stable, he wishes to work on weight loss and this was discussed and counseled today.

## 2018-10-16 ENCOUNTER — Other Ambulatory Visit: Payer: Self-pay | Admitting: Internal Medicine

## 2018-10-16 DIAGNOSIS — E782 Mixed hyperlipidemia: Secondary | ICD-10-CM

## 2018-10-16 DIAGNOSIS — I10 Essential (primary) hypertension: Secondary | ICD-10-CM

## 2018-12-24 ENCOUNTER — Encounter: Payer: Self-pay | Admitting: Internal Medicine

## 2018-12-24 ENCOUNTER — Other Ambulatory Visit: Payer: Self-pay

## 2018-12-24 ENCOUNTER — Other Ambulatory Visit (INDEPENDENT_AMBULATORY_CARE_PROVIDER_SITE_OTHER): Payer: 59

## 2018-12-24 ENCOUNTER — Ambulatory Visit (INDEPENDENT_AMBULATORY_CARE_PROVIDER_SITE_OTHER): Payer: 59 | Admitting: Internal Medicine

## 2018-12-24 VITALS — BP 130/80 | HR 70 | Temp 99.2°F | Ht 73.0 in | Wt 244.0 lb

## 2018-12-24 DIAGNOSIS — Z8042 Family history of malignant neoplasm of prostate: Secondary | ICD-10-CM | POA: Insufficient documentation

## 2018-12-24 DIAGNOSIS — Z23 Encounter for immunization: Secondary | ICD-10-CM

## 2018-12-24 LAB — PSA: PSA: 1.54 ng/mL (ref 0.10–4.00)

## 2018-12-24 NOTE — Assessment & Plan Note (Signed)
Checking PSA, no new symptoms of BPH.

## 2018-12-24 NOTE — Progress Notes (Signed)
   Subjective:   Patient ID: Theodore Whitney, male    DOB: Jun 14, 1965, 53 y.o.   MRN: [id number]  HPI The patient is a 53 YO man coming in for concerns about his PSA. He normally gets this checked yearly due to both brothers with prostate cancer. He denies new symptoms of urinary urgency, poor stream, multiple night time awakenings. Denies weight loss. Denies blood in urine or dysuria.   Review of Systems  Constitutional: Negative.   HENT: Negative.   Eyes: Negative.   Respiratory: Negative for cough, chest tightness and shortness of breath.   Cardiovascular: Negative for chest pain, palpitations and leg swelling.  Gastrointestinal: Negative for abdominal distention, abdominal pain, constipation, diarrhea, nausea and vomiting.  Musculoskeletal: Negative.   Skin: Negative.   Neurological: Negative.   Psychiatric/Behavioral: Negative.     Objective:  Physical Exam Constitutional:      Appearance: He is well-developed.  HENT:     Head: Normocephalic and atraumatic.  Neck:     Musculoskeletal: Normal range of motion.  Cardiovascular:     Rate and Rhythm: Normal rate and regular rhythm.  Pulmonary:     Effort: Pulmonary effort is normal. No respiratory distress.     Breath sounds: Normal breath sounds. No wheezing or rales.  Abdominal:     General: Bowel sounds are normal. There is no distension.     Palpations: Abdomen is soft.     Tenderness: There is no abdominal tenderness. There is no rebound.  Skin:    General: Skin is warm and dry.  Neurological:     Mental Status: He is alert and oriented to person, place, and time.     Coordination: Coordination normal.     Vitals:   12/24/18 0923  BP: 130/80  Pulse: 70  Temp: 99.2 F (37.3 C)  TempSrc: Oral  SpO2: 97%  Weight: 244 lb (110.7 kg)  Height: 6\' 1"  (1.854 m)    This visit occurred during the SARS-CoV-2 public health emergency.  Safety protocols were in place, including screening questions prior to the visit,  additional usage of staff PPE, and extensive cleaning of exam room while observing appropriate contact time as indicated for disinfecting solutions.   Assessment & Plan:  Shingrix IM given at visit

## 2018-12-24 NOTE — Patient Instructions (Signed)
We will check the PSA today.

## 2019-04-29 ENCOUNTER — Ambulatory Visit (INDEPENDENT_AMBULATORY_CARE_PROVIDER_SITE_OTHER): Payer: 59 | Admitting: Family

## 2019-04-29 ENCOUNTER — Encounter: Payer: Self-pay | Admitting: Family

## 2019-04-29 ENCOUNTER — Other Ambulatory Visit: Payer: Self-pay

## 2019-04-29 VITALS — BP 138/84 | HR 84 | Temp 98.2°F | Ht 73.0 in | Wt 242.0 lb

## 2019-04-29 DIAGNOSIS — R109 Unspecified abdominal pain: Secondary | ICD-10-CM

## 2019-04-29 LAB — CBC WITH DIFFERENTIAL/PLATELET
Basophils Absolute: 0 10*3/uL (ref 0.0–0.1)
Basophils Relative: 0.5 % (ref 0.0–3.0)
Eosinophils Absolute: 0.1 10*3/uL (ref 0.0–0.7)
Eosinophils Relative: 2 % (ref 0.0–5.0)
HCT: 39.9 % (ref 39.0–52.0)
Hemoglobin: 13.5 g/dL (ref 13.0–17.0)
Lymphocytes Relative: 42.5 % (ref 12.0–46.0)
Lymphs Abs: 2.2 10*3/uL (ref 0.7–4.0)
MCHC: 33.8 g/dL (ref 30.0–36.0)
MCV: 98.7 fl (ref 78.0–100.0)
Monocytes Absolute: 0.3 10*3/uL (ref 0.1–1.0)
Monocytes Relative: 6.4 % (ref 3.0–12.0)
Neutro Abs: 2.5 10*3/uL (ref 1.4–7.7)
Neutrophils Relative %: 48.6 % (ref 43.0–77.0)
Platelets: 240 10*3/uL (ref 150.0–400.0)
RBC: 4.04 Mil/uL — ABNORMAL LOW (ref 4.22–5.81)
RDW: 13.3 % (ref 11.5–15.5)
WBC: 5.2 10*3/uL (ref 4.0–10.5)

## 2019-04-29 LAB — COMPREHENSIVE METABOLIC PANEL
ALT: 25 U/L (ref 0–53)
AST: 17 U/L (ref 0–37)
Albumin: 4.2 g/dL (ref 3.5–5.2)
Alkaline Phosphatase: 90 U/L (ref 39–117)
BUN: 17 mg/dL (ref 6–23)
CO2: 29 mEq/L (ref 19–32)
Calcium: 9.2 mg/dL (ref 8.4–10.5)
Chloride: 106 mEq/L (ref 96–112)
Creatinine, Ser: 1.12 mg/dL (ref 0.40–1.50)
GFR: 83.43 mL/min (ref >60.00)
Glucose, Bld: 112 mg/dL — ABNORMAL HIGH (ref 70–99)
Potassium: 3.3 mEq/L — ABNORMAL LOW (ref 3.5–5.1)
Sodium: 142 mEq/L (ref 135–145)
Total Bilirubin: 0.5 mg/dL (ref 0.2–1.2)
Total Protein: 6.8 g/dL (ref 6.0–8.3)

## 2019-04-29 LAB — LIPASE: Lipase: 36 U/L (ref 11.0–59.0)

## 2019-04-29 LAB — AMYLASE: Amylase: 80 U/L (ref 27–131)

## 2019-04-29 MED ORDER — METRONIDAZOLE 500 MG PO TABS: 500.0000 mg | ORAL_TABLET | Freq: Three times a day (TID) | ORAL | 0 refills | Status: DC

## 2019-04-29 MED ORDER — PANTOPRAZOLE SODIUM 40 MG PO TBEC: 40.0000 mg | DELAYED_RELEASE_TABLET | Freq: Every day | ORAL | 0 refills | Status: DC

## 2019-04-29 MED ORDER — CIPROFLOXACIN HCL 500 MG PO TABS: 500.0000 mg | ORAL_TABLET | Freq: Two times a day (BID) | ORAL | 0 refills | Status: DC
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
## 2019-04-29 NOTE — Patient Instructions (Signed)
Diverticulitis  Diverticulitis is infection or inflammation of small pouches (diverticula) in the colon that form due to a condition called diverticulosis. Diverticula can trap stool (feces) and bacteria, causing infection and inflammation. Diverticulitis may cause severe stomach pain and diarrhea. It may lead to tissue damage in the colon that causes bleeding. The diverticula may also burst (rupture) and cause infected stool to enter other areas of the abdomen. Complications of diverticulitis can include:  Bleeding.  Severe infection.  Severe pain.  Rupture (perforation) of the colon.  Blockage (obstruction) of the colon. What are the causes? This condition is caused by stool becoming trapped in the diverticula, which allows bacteria to grow in the diverticula. This leads to inflammation and infection. What increases the risk? You are more likely to develop this condition if:  You have diverticulosis. The risk for diverticulosis increases if: ? You are overweight or obese. ? You use tobacco products. ? You do not get enough exercise.  You eat a diet that does not include enough fiber. High-fiber foods include fruits, vegetables, beans, nuts, and whole grains. What are the signs or symptoms? Symptoms of this condition may include:  Pain and tenderness in the abdomen. The pain is normally located on the left side of the abdomen, but it may occur in other areas.  Fever and chills.  Bloating.  Cramping.  Nausea.  Vomiting.  Changes in bowel routines.  Blood in your stool. How is this diagnosed? This condition is diagnosed based on:  Your medical history.  A physical exam.  Tests to make sure there is nothing else causing your condition. These tests may include: ? Blood tests. ? Urine tests. ? Imaging tests of the abdomen, including X-rays, ultrasounds, MRIs, or CT scans. How is this treated? Most cases of this condition are mild and can be treated at home.  Treatment may include:  Taking over-the-counter pain medicines.  Following a clear liquid diet.  Taking antibiotic medicines by mouth.  Rest. More severe cases may need to be treated at a hospital. Treatment may include:  Not eating or drinking.  Taking prescription pain medicine.  Receiving antibiotic medicines through an IV tube.  Receiving fluids and nutrition through an IV tube.  Surgery. When your condition is under control, your health care provider may recommend that you have a colonoscopy. This is an exam to look at the entire large intestine. During the exam, a lubricated, bendable tube is inserted into the anus and then passed into the rectum, colon, and other parts of the large intestine. A colonoscopy can show how severe your diverticula are and whether something else may be causing your symptoms. Follow these instructions at home: Medicines  Take over-the-counter and prescription medicines only as told by your health care provider. These include fiber supplements, probiotics, and stool softeners.  If you were prescribed an antibiotic medicine, take it as told by your health care provider. Do not stop taking the antibiotic even if you start to feel better.  Do not drive or use heavy machinery while taking prescription pain medicine. General instructions   Follow a full liquid diet or another diet as directed by your health care provider. After your symptoms improve, your health care provider may tell you to change your diet. He or she may recommend that you eat a diet that contains at least 25 g (25 grams) of fiber daily. Fiber makes it easier to pass stool. Healthy sources of fiber include: ? Berries. One cup contains 4-8 grams of   fiber. ? Beans or lentils. One half cup contains 5-8 grams of fiber. ? Green vegetables. One cup contains 4 grams of fiber.  Exercise for at least 30 minutes, 3 times each week. You should exercise hard enough to raise your heart rate and  break a sweat.  Keep all follow-up visits as told by your health care provider. This is important. You may need a colonoscopy. Contact a health care provider if:  Your pain does not improve.  You have a hard time drinking or eating food.  Your bowel movements do not return to normal. Get help right away if:  Your pain gets worse.  Your symptoms do not get better with treatment.  Your symptoms suddenly get worse.  You have a fever.  You vomit more than one time.  You have stools that are bloody, black, or tarry. Summary  Diverticulitis is infection or inflammation of small pouches (diverticula) in the colon that form due to a condition called diverticulosis. Diverticula can trap stool (feces) and bacteria, causing infection and inflammation.  You are at higher risk for this condition if you have diverticulosis and you eat a diet that does not include enough fiber.  Most cases of this condition are mild and can be treated at home. More severe cases may need to be treated at a hospital.  When your condition is under control, your health care provider may recommend that you have an exam called a colonoscopy. This exam can show how severe your diverticula are and whether something else may be causing your symptoms. This information is not intended to replace advice given to you by your health care provider. Make sure you discuss any questions you have with your health care provider. Document Revised: 12/30/2016 Document Reviewed: 02/20/2016 Elsevier Patient Education  2020 [employer]  

## 2019-04-30 ENCOUNTER — Other Ambulatory Visit: Payer: Self-pay | Admitting: Family

## 2019-04-30 DIAGNOSIS — R109 Unspecified abdominal pain: Secondary | ICD-10-CM

## 2019-04-30 MED ORDER — POTASSIUM CHLORIDE ER 10 MEQ PO TBCR: 10.0000 meq | EXTENDED_RELEASE_TABLET | Freq: Every day | ORAL | 0 refills | Status: DC

## 2019-05-01 ENCOUNTER — Telehealth: Payer: Self-pay | Admitting: Internal Medicine

## 2019-05-01 NOTE — Telephone Encounter (Signed)
Pt states he is still seeing blood in urine and wondering if he should come in to get another urinalysis. Please call pt

## 2019-05-01 NOTE — Telephone Encounter (Signed)
Spoke with patient this morning. Info given about appointment with Dr. Yetta Barre. Patient was unable to schedule at current time but will call back once he figures out his work schedule and best time to come in. Patient advised to call back and set up appointment with Dr. Yetta Barre.

## 2019-05-02 ENCOUNTER — Ambulatory Visit (INDEPENDENT_AMBULATORY_CARE_PROVIDER_SITE_OTHER): Payer: 59 | Admitting: Internal Medicine

## 2019-05-02 ENCOUNTER — Encounter: Payer: Self-pay | Admitting: Internal Medicine

## 2019-05-02 ENCOUNTER — Other Ambulatory Visit: Payer: Self-pay

## 2019-05-02 VITALS — BP 156/88 | HR 84 | Temp 99.0°F | Resp 16 | Wt 241.0 lb

## 2019-05-02 DIAGNOSIS — E876 Hypokalemia: Secondary | ICD-10-CM | POA: Diagnosis not present

## 2019-05-02 DIAGNOSIS — T502X5A Adverse effect of carbonic-anhydrase inhibitors, benzothiadiazides and other diuretics, initial encounter: Secondary | ICD-10-CM | POA: Diagnosis not present

## 2019-05-02 DIAGNOSIS — R310 Gross hematuria: Secondary | ICD-10-CM

## 2019-05-02 DIAGNOSIS — R8281 Pyuria: Secondary | ICD-10-CM | POA: Diagnosis not present

## 2019-05-02 DIAGNOSIS — N029 Recurrent and persistent hematuria with unspecified morphologic changes: Secondary | ICD-10-CM | POA: Insufficient documentation

## 2019-05-02 LAB — POC URINALSYSI DIPSTICK (AUTOMATED)
Bilirubin, UA: NEGATIVE
Blood, UA: POSITIVE
Glucose, UA: NEGATIVE
Ketones, UA: NEGATIVE
Nitrite, UA: NEGATIVE
Protein, UA: NEGATIVE
Spec Grav, UA: 1.025 (ref 1.010–1.025)
Urobilinogen, UA: 0.2 E.U./dL
pH, UA: 6 (ref 5.0–8.0)

## 2019-05-02 MED ORDER — POTASSIUM CHLORIDE CRYS ER 20 MEQ PO TBCR: 20.0000 meq | EXTENDED_RELEASE_TABLET | Freq: Two times a day (BID) | ORAL | 0 refills | Status: DC

## 2019-05-02 NOTE — Patient Instructions (Signed)
Hematuria, Adult Hematuria is blood in the urine. Blood may be visible in the urine, or it may be identified with a test. This condition can be caused by infections of the bladder, urethra, kidney, or prostate. Other possible causes include:  Kidney stones.  Cancer of the urinary tract.  Too much calcium in the urine.  Conditions that are passed from parent to child (inherited conditions).  Exercise that requires a lot of energy. Infections can usually be treated with medicine, and a kidney stone usually will pass through your urine. If neither of these is the cause of your hematuria, more tests may be needed to identify the cause of your symptoms. It is very important to tell your health care provider about any blood in your urine, even if it is painless or the blood stops without treatment. Blood in the urine, when it happens and then stops and then happens again, can be a symptom of a very serious condition, including cancer. There is no pain in the initial stages of many urinary cancers. Follow these instructions at home: Medicines  Take over-the-counter and prescription medicines only as told by your health care provider.  If you were prescribed an antibiotic medicine, take it as told by your health care provider. Do not stop taking the antibiotic even if you start to feel better. Eating and drinking  Drink enough fluid to keep your urine clear or pale yellow. It is recommended that you drink 3-4 quarts (2.8-3.8 L) a day. If you have been diagnosed with an infection, it is recommended that you drink cranberry juice in addition to large amounts of water.  Avoid caffeine, tea, and carbonated beverages. These tend to irritate the bladder.  Avoid alcohol because it may irritate the prostate (men). General instructions  If you have been diagnosed with a kidney stone, follow your health care provider's instructions about straining your urine to catch the stone.  Empty your bladder  often. Avoid holding urine for long periods of time.  If you are male: ? After a bowel movement, wipe from front to back and use each piece of toilet paper only once. ? Empty your bladder before and after sex.  Pay attention to any changes in your symptoms. Tell your health care provider about any changes or any new symptoms.  It is your responsibility to get your test results. Ask your health care provider, or the department performing the test, when your results will be ready.  Keep all follow-up visits as told by your health care provider. This is important. Contact a health care provider if:  You develop back pain.  You have a fever.  You have nausea or vomiting.  Your symptoms do not improve after 3 days.  Your symptoms get worse. Get help right away if:  You develop severe vomiting and are unable take medicine without vomiting.  You develop severe pain in your back or abdomen even though you are taking medicine.  You pass a large amount of blood in your urine.  You pass blood clots in your urine.  You feel very weak or like you might faint.  You faint. Summary  Hematuria is blood in the urine. It has many possible causes.  It is very important that you tell your health care provider about any blood in your urine, even if it is painless or the blood stops without treatment.  Take over-the-counter and prescription medicines only as told by your health care provider.  Drink enough fluid to keep   your urine clear or pale yellow. This information is not intended to replace advice given to you by your health care provider. Make sure you discuss any questions you have with your health care provider. Document Revised: 06/13/2018 Document Reviewed: 02/20/2016 Elsevier Patient Education  2020 [employer] Inc.  

## 2019-05-02 NOTE — Progress Notes (Signed)
Subjective:  Patient ID: Theodore Whitney, male    DOB: 01/18/1966  Age: 54 y.o. MRN: [id number]  CC: Hypertension and Urinary Tract Infection  This visit occurred during the SARS-CoV-2 public health emergency.  Safety protocols were in place, including screening questions prior to the visit, additional usage of staff PPE, and extensive cleaning of exam room while observing appropriate contact time as indicated for disinfecting solutions.    HPI Theodore Whitney presents for f/up - He complains that his urine has appeared dark for the last week and he thinks it has been bloody.  He has also had mild, diffuse, intermittent abdominal pain.  He saw someone else a few days ago and it was suspected that he has diverticulitis so he has been taking a course of metronidazole and ciprofloxacin.  This has not helped much.  He is scheduled for CT scan of the abdomen.  His labs are remarkable only for mild hypokalemia.  Outpatient Medications Prior to Visit  Medication Sig Dispense Refill  . ciprofloxacin (CIPRO) 500 MG tablet Take 1 tablet (500 mg total) by mouth 2 (two) times daily. 14 tablet 0  . hydrochlorothiazide (HYDRODIURIL) 25 MG tablet TAKE 1 TABLET BY MOUTH  DAILY 90 tablet 3  . metroNIDAZOLE (FLAGYL) 500 MG tablet Take 1 tablet (500 mg total) by mouth 3 (three) times daily. 21 tablet 0  . pantoprazole (PROTONIX) 40 MG tablet Take 1 tablet (40 mg total) by mouth daily. 30 tablet 0  . simvastatin (ZOCOR) 40 MG tablet TAKE 1 TABLET BY MOUTH AT  BEDTIME 90 tablet 3  . potassium chloride (KLOR-CON) 10 MEQ tablet Take 1 tablet (10 mEq total) by mouth daily for 5 days. 5 tablet 0   No facility-administered medications prior to visit.    ROS Review of Systems  Constitutional: Negative for appetite change, chills, diaphoresis, fatigue and fever.  HENT: Negative.   Eyes: Negative for visual disturbance.  Respiratory: Negative for cough, chest tightness, shortness of breath and wheezing.     Cardiovascular: Negative for chest pain, palpitations and leg swelling.  Gastrointestinal: Positive for abdominal pain. Negative for abdominal distention, blood in stool, constipation, diarrhea, nausea and vomiting.  Endocrine: Negative.   Genitourinary: Positive for dysuria and hematuria. Negative for decreased urine volume, difficulty urinating, discharge, flank pain, penile pain, penile swelling, testicular pain and urgency.  Musculoskeletal: Negative.  Negative for arthralgias and myalgias.  Skin: Negative.  Negative for color change and rash.  Neurological: Negative.  Negative for dizziness, weakness, light-headedness and headaches.  Hematological: Negative for adenopathy. Does not bruise/bleed easily.  Psychiatric/Behavioral: Negative.     Objective:  BP (!) 156/88 (BP Location: Left Arm, Patient Position: Sitting)   Pulse 84   Temp 99 F (37.2 C) (Oral)   Resp 16   Wt 241 lb (109.3 kg)   SpO2 98%   BMI 31.80 kg/m   BP Readings from Last 3 Encounters:  05/02/19 (!) 156/88  04/29/19 138/84  12/24/18 130/80    Wt Readings from Last 3 Encounters:  05/02/19 241 lb (109.3 kg)  04/29/19 242 lb (109.8 kg)  12/24/18 244 lb (110.7 kg)    Physical Exam Vitals reviewed.  Constitutional:      General: He is not in acute distress.    Appearance: Normal appearance. He is not ill-appearing, toxic-appearing or diaphoretic.  HENT:     Nose: Nose normal.     Mouth/Throat:     Mouth: Mucous membranes are moist.  Eyes:  General: No scleral icterus.    Conjunctiva/sclera: Conjunctivae normal.  Cardiovascular:     Rate and Rhythm: Normal rate and regular rhythm.     Heart sounds: No murmur.  Pulmonary:     Effort: Pulmonary effort is normal.     Breath sounds: No stridor. No wheezing, rhonchi or rales.  Abdominal:     General: Abdomen is flat. Bowel sounds are normal. There is no distension.     Palpations: Abdomen is soft. There is no hepatomegaly, splenomegaly or mass.      Tenderness: There is no abdominal tenderness. There is no guarding or rebound.     Hernia: No hernia is present. There is no hernia in the left inguinal area or right inguinal area.  Genitourinary:    Pubic Area: No rash.      Penis: Normal and circumcised. No discharge, swelling or lesions.      Testes: Normal.        Right: Mass, tenderness or swelling not present.        Left: Mass, tenderness or swelling not present.     Epididymis:     Right: Normal. Not inflamed or enlarged. No mass or tenderness.     Left: Not inflamed or enlarged. No mass or tenderness.     Prostate: Normal. Not enlarged, not tender and no nodules present.     Rectum: Normal. Guaiac result negative. No mass, tenderness, anal fissure, external hemorrhoid or internal hemorrhoid. Normal anal tone.  Musculoskeletal:        General: Normal range of motion.     Cervical back: Neck supple.     Right lower leg: No edema.     Left lower leg: No edema.  Lymphadenopathy:     Cervical: No cervical adenopathy.     Lower Body: No right inguinal adenopathy. No left inguinal adenopathy.  Skin:    General: Skin is warm and dry.  Neurological:     General: No focal deficit present.     Mental Status: He is alert.     Lab Results  Component Value Date   WBC 5.2 04/29/2019   HGB 13.5 04/29/2019   HCT 39.9 04/29/2019   PLT 240.0 04/29/2019   GLUCOSE 112 (H) 04/29/2019   CHOL 151 09/26/2018   TRIG 113.0 09/26/2018   HDL 38.60 (L) 09/26/2018   LDLDIRECT 178.1 09/01/2011   LDLCALC 90 09/26/2018   ALT 25 04/29/2019   AST 17 04/29/2019   NA 142 04/29/2019   K 3.3 (L) 04/29/2019   CL 106 04/29/2019   CREATININE 1.12 04/29/2019   BUN 17 04/29/2019   CO2 29 04/29/2019   TSH 1.14 09/12/2016   PSA 1.54 12/24/2018   HGBA1C 5.5 09/26/2018    CT Renal Stone Study  Result Date: 09/02/2016 CLINICAL DATA:  Initial evaluation for acute right flank pain. EXAM: CT ABDOMEN AND PELVIS WITHOUT CONTRAST TECHNIQUE: Multidetector  CT imaging of the abdomen and pelvis was performed following the standard protocol without IV contrast. COMPARISON:  None available. FINDINGS: Lower chest: Visualized lung bases are clear. Hepatobiliary: Suggestion of vague hypodense lesion measuring approximately 2.7 cm within the posterior right hepatic lobe, indeterminate, and not well evaluated on this noncontrast examination. This is of doubtful significance in the acute setting. Liver otherwise unremarkable. Gallbladder within normal limits. No biliary dilatation. Pancreas: Pancreas within normal limits. Spleen: Spleen within normal limits. Adrenals/Urinary Tract: Adrenal glands are normal. 16 mm cyst noted at the lower pole of the left kidney. No nephrolithiasis  or hydronephrosis. No radiopaque calculi seen along the course of either renal collecting system. No hydroureter. Partially distended bladder within normal limits. No layering stones within the bladder lumen. Stomach/Bowel: Stomach within normal limits. No evidence for bowel obstruction. Appendix is normal. Mild colonic diverticulosis without evidence for acute diverticulitis. Mild circumferential wall thickening about the distal transverse colon favored to be related incomplete distension. No acute inflammatory changes seen about the bowels. Vascular/Lymphatic: Intra-abdominal aorta of normal caliber. No adenopathy. Reproductive: Prostate within normal limits. Other: No free air or fluid. Musculoskeletal: No acute osseus abnormality. No worrisome lytic or blastic osseous lesions. Prominent facet arthrosis noted at L4-5. IMPRESSION: 1. No CT evidence for nephrolithiasis or obstructive uropathy. 2. No other acute intra-abdominal or pelvic process. 3. Normal appendix. 4. Mild colonic diverticulosis without evidence for acute diverticulitis. Electronically Signed   By: Jeannine Boga M.D.   On: 09/02/2016 04:37   11:35  (05/02/19) 2 yr ago  (09/12/16) 2 yr ago  (09/02/16)    Color, UA  orange       Clarity, UA  clear     Glucose, UA Negative Negative     Bilirubin, UA  Negative     Ketones, UA  Negative     Spec Grav, UA 1.010 - 1.025 1.025     Blood, UA  POSITIVE     Comment: 10 Ery/uL  pH, UA 5.0 - 8.0 6.0     Protein, UA Negative Negative     Urobilinogen, UA 0.2 or 1.0 E.U./dL 0.2  0.2 R    Nitrite, UA  Negative     Leukocytes, UA Negative Small (1+)Abnormal   NEGATIVE  NEGATIVE R   Comment: 70 Leu/uL      Assessment & Plan:   Elizer was seen today for hypertension and urinary tract infection.  Diagnoses and all orders for this visit:  Gross hematuria- Based on his symptoms and exam I do not see a source for bleeding or infection.  His dipstick UA is positive for 10 RBCs and 70 WBCs.  Screening for gonorrhea and chlamydia is negative.  His urine culture is negative.  I recommended that he proceed with the CT scan as recommended. -     POCT Urinalysis Dipstick (Automated) -     CULTURE, URINE COMPREHENSIVE; Future -     GC/Chlamydia Probe Amp; Future -     GC/Chlamydia Probe Amp -     CULTURE, URINE COMPREHENSIVE  Pyuria- See above. -     CULTURE, URINE COMPREHENSIVE; Future -     GC/Chlamydia Probe Amp; Future -     GC/Chlamydia Probe Amp -     CULTURE, URINE COMPREHENSIVE  Diuretic-induced hypokalemia- I have asked him to increase the dose of the potassium supplement. -     potassium chloride SA (KLOR-CON) 20 MEQ tablet; Take 1 tablet (20 mEq total) by mouth 2 (two) times daily.   I have discontinued Eustace Quail. Diegel's potassium chloride. I am also having him start on potassium chloride SA. Additionally, I am having him maintain his hydrochlorothiazide, simvastatin, metroNIDAZOLE, ciprofloxacin, and pantoprazole.  Meds ordered this encounter  Medications  . potassium chloride SA (KLOR-CON) 20 MEQ tablet    Sig: Take 1 tablet (20 mEq total) by mouth 2 (two) times daily.    Dispense:  180 tablet    Refill:  0     Follow-up: Return in about 3 weeks (around  05/23/2019).  Scarlette Calico, MD

## 2019-05-04 LAB — CULTURE, URINE COMPREHENSIVE: RESULT:: NO GROWTH

## 2019-05-04 LAB — GC/CHLAMYDIA PROBE AMP
Chlamydia trachomatis, NAA: NEGATIVE
Neisseria Gonorrhoeae by PCR: NEGATIVE

## 2019-05-05 ENCOUNTER — Encounter: Payer: Self-pay | Admitting: Internal Medicine

## 2019-05-06 ENCOUNTER — Other Ambulatory Visit: Payer: Self-pay

## 2019-05-06 ENCOUNTER — Ambulatory Visit (INDEPENDENT_AMBULATORY_CARE_PROVIDER_SITE_OTHER)
Admission: RE | Admit: 2019-05-06 | Discharge: 2019-05-06 | Disposition: A | Discharge status: Home / Self Care | Payer: 59 | Source: Ambulatory Visit | Attending: Family | Admitting: Family

## 2019-05-06 DIAGNOSIS — R109 Unspecified abdominal pain: Secondary | ICD-10-CM

## 2019-05-06 MED ORDER — IOHEXOL 300 MG/ML  SOLN
100.0000 mL | Freq: Once | INTRAMUSCULAR | Status: AC | PRN
  Administered 2019-05-06: 14:00:00 100 mL via INTRAVENOUS

## 2019-05-07 ENCOUNTER — Other Ambulatory Visit: Payer: Self-pay | Admitting: Family

## 2019-05-07 DIAGNOSIS — K769 Liver disease, unspecified: Secondary | ICD-10-CM

## 2019-06-04 ENCOUNTER — Inpatient Hospital Stay: Admission: RE | Admit: 2019-06-04 | Payer: 59 | Source: Ambulatory Visit

## 2019-06-06 ENCOUNTER — Other Ambulatory Visit: Payer: Self-pay | Admitting: Family

## 2019-06-06 ENCOUNTER — Telehealth: Payer: Self-pay

## 2019-06-06 DIAGNOSIS — M795 Residual foreign body in soft tissue: Secondary | ICD-10-CM

## 2019-06-06 NOTE — Telephone Encounter (Signed)
Xray orders done by Vernona Rieger today.
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
## 2019-07-02 ENCOUNTER — Other Ambulatory Visit: Payer: 59

## 2019-07-04 ENCOUNTER — Telehealth: Payer: Self-pay

## 2019-07-04 ENCOUNTER — Ambulatory Visit
Admission: RE | Admit: 2019-07-04 | Discharge: 2019-07-04 | Disposition: A | Discharge status: Home / Self Care | Payer: 59 | Source: Ambulatory Visit | Attending: Family | Admitting: Family

## 2019-07-04 ENCOUNTER — Other Ambulatory Visit: Payer: Self-pay | Admitting: Family

## 2019-07-04 DIAGNOSIS — M795 Residual foreign body in soft tissue: Secondary | ICD-10-CM

## 2019-07-04 DIAGNOSIS — K769 Liver disease, unspecified: Secondary | ICD-10-CM

## 2019-07-04 DIAGNOSIS — R160 Hepatomegaly, not elsewhere classified: Secondary | ICD-10-CM

## 2019-07-04 NOTE — Telephone Encounter (Signed)
Ordered head CT

## 2019-07-08 ENCOUNTER — Ambulatory Visit
Admission: RE | Admit: 2019-07-08 | Discharge: 2019-07-08 | Disposition: A | Discharge status: Home / Self Care | Payer: 59 | Source: Ambulatory Visit | Attending: Internal Medicine | Admitting: Internal Medicine

## 2019-07-08 DIAGNOSIS — R160 Hepatomegaly, not elsewhere classified: Secondary | ICD-10-CM

## 2019-07-18 ENCOUNTER — Ambulatory Visit
Admission: RE | Admit: 2019-07-18 | Discharge: 2019-07-18 | Disposition: A | Discharge status: Home / Self Care | Payer: 59 | Source: Ambulatory Visit | Attending: Family | Admitting: Family

## 2019-07-18 MED ORDER — GADOBENATE DIMEGLUMINE 529 MG/ML IV SOLN
20.0000 mL | Freq: Once | INTRAVENOUS | Status: AC | PRN
  Administered 2019-07-18: 20 mL via INTRAVENOUS

## 2019-09-05 ENCOUNTER — Other Ambulatory Visit: Payer: Self-pay | Admitting: Internal Medicine

## 2019-09-05 DIAGNOSIS — I10 Essential (primary) hypertension: Secondary | ICD-10-CM

## 2019-09-05 DIAGNOSIS — E782 Mixed hyperlipidemia: Secondary | ICD-10-CM

## 2019-09-21 ENCOUNTER — Other Ambulatory Visit: Payer: Self-pay | Admitting: Internal Medicine

## 2019-09-21 DIAGNOSIS — I10 Essential (primary) hypertension: Secondary | ICD-10-CM

## 2019-09-21 DIAGNOSIS — E782 Mixed hyperlipidemia: Secondary | ICD-10-CM

## 2019-11-14 ENCOUNTER — Other Ambulatory Visit: Payer: Self-pay | Admitting: Internal Medicine

## 2019-11-14 DIAGNOSIS — E782 Mixed hyperlipidemia: Secondary | ICD-10-CM

## 2019-11-14 DIAGNOSIS — I10 Essential (primary) hypertension: Secondary | ICD-10-CM

## 2019-11-19 ENCOUNTER — Other Ambulatory Visit: Payer: Self-pay | Admitting: Internal Medicine

## 2019-11-19 DIAGNOSIS — I10 Essential (primary) hypertension: Secondary | ICD-10-CM

## 2019-11-19 DIAGNOSIS — E782 Mixed hyperlipidemia: Secondary | ICD-10-CM

## 2019-11-30 ENCOUNTER — Other Ambulatory Visit: Payer: Self-pay | Admitting: Internal Medicine

## 2019-11-30 DIAGNOSIS — I10 Essential (primary) hypertension: Secondary | ICD-10-CM

## 2019-11-30 DIAGNOSIS — E782 Mixed hyperlipidemia: Secondary | ICD-10-CM

## 2020-07-15 ENCOUNTER — Ambulatory Visit (INDEPENDENT_AMBULATORY_CARE_PROVIDER_SITE_OTHER): Payer: 59 | Admitting: Internal Medicine

## 2020-07-15 ENCOUNTER — Other Ambulatory Visit: Payer: Self-pay

## 2020-07-15 ENCOUNTER — Encounter: Payer: Self-pay | Admitting: Internal Medicine

## 2020-07-15 VITALS — BP 138/92 | HR 70 | Temp 98.6°F | Ht 72.0 in | Wt 238.6 lb

## 2020-07-15 DIAGNOSIS — I10 Essential (primary) hypertension: Secondary | ICD-10-CM | POA: Diagnosis not present

## 2020-07-15 DIAGNOSIS — Z8042 Family history of malignant neoplasm of prostate: Secondary | ICD-10-CM

## 2020-07-15 DIAGNOSIS — K219 Gastro-esophageal reflux disease without esophagitis: Secondary | ICD-10-CM | POA: Diagnosis not present

## 2020-07-15 LAB — COMPREHENSIVE METABOLIC PANEL
ALT: 23 U/L (ref 0–53)
AST: 19 U/L (ref 0–37)
Albumin: 4.7 g/dL (ref 3.5–5.2)
Alkaline Phosphatase: 91 U/L (ref 39–117)
BUN: 18 mg/dL (ref 6–23)
CO2: 31 mEq/L (ref 19–32)
Calcium: 9.8 mg/dL (ref 8.4–10.5)
Chloride: 101 mEq/L (ref 96–112)
Creatinine, Ser: 1.3 mg/dL (ref 0.40–1.50)
GFR: 63.01 mL/min (ref >60.00)
Glucose, Bld: 108 mg/dL — ABNORMAL HIGH (ref 70–99)
Potassium: 3.5 mEq/L (ref 3.5–5.1)
Sodium: 139 mEq/L (ref 135–145)
Total Bilirubin: 0.9 mg/dL (ref 0.2–1.2)
Total Protein: 7.4 g/dL (ref 6.0–8.3)

## 2020-07-15 LAB — LIPID PANEL
Cholesterol: 160 mg/dL (ref 0–200)
HDL: 38.9 mg/dL — ABNORMAL LOW (ref >39.00)
LDL Cholesterol: 101 mg/dL — ABNORMAL HIGH (ref 0–99)
NonHDL: 121.57
Total CHOL/HDL Ratio: 4
Triglycerides: 103 mg/dL (ref 0.0–149.0)
VLDL: 20.6 mg/dL (ref 0.0–40.0)

## 2020-07-15 LAB — CBC
HCT: 42.4 % (ref 39.0–52.0)
Hemoglobin: 14.2 g/dL (ref 13.0–17.0)
MCHC: 33.5 g/dL (ref 30.0–36.0)
MCV: 96.6 fl (ref 78.0–100.0)
Platelets: 246 10*3/uL (ref 150.0–400.0)
RBC: 4.39 Mil/uL (ref 4.22–5.81)
RDW: 13.3 % (ref 11.5–15.5)
WBC: 7.3 10*3/uL (ref 4.0–10.5)

## 2020-07-15 LAB — PSA: PSA: 2.23 ng/mL (ref 0.10–4.00)

## 2020-07-15 NOTE — Progress Notes (Signed)
   Subjective:   Patient ID: Theodore Whitney, male    DOB: 06-21-1965, 55 y.o.   MRN: [id number]  HPI The patient is a 55 YO man coming in for heartburn/chest discomfort for about 1 week. Is mostly gone now. Wanted to be seen and get EKG to be sure this was not cardiac. Did not take anything for this. No recent diet or medication changes.   Review of Systems  Constitutional: Negative.   HENT: Negative.    Eyes: Negative.   Respiratory:  Positive for chest tightness. Negative for cough and shortness of breath.   Cardiovascular:  Negative for chest pain, palpitations and leg swelling.  Gastrointestinal:  Negative for abdominal distention, abdominal pain, constipation, diarrhea, nausea and vomiting.       GERD  Musculoskeletal: Negative.   Skin: Negative.   Neurological: Negative.   Psychiatric/Behavioral: Negative.     Objective:  Physical Exam Constitutional:      Appearance: He is well-developed.  HENT:     Head: Normocephalic and atraumatic.  Cardiovascular:     Rate and Rhythm: Normal rate and regular rhythm.  Pulmonary:     Effort: Pulmonary effort is normal. No respiratory distress.     Breath sounds: Normal breath sounds. No wheezing or rales.  Abdominal:     General: Bowel sounds are normal. There is no distension.     Palpations: Abdomen is soft.     Tenderness: There is no abdominal tenderness. There is no rebound.  Musculoskeletal:     Cervical back: Normal range of motion.  Skin:    General: Skin is warm and dry.  Neurological:     Mental Status: He is alert and oriented to person, place, and time.     Coordination: Coordination normal.    Vitals:   07/15/20 1059  BP: (!) 138/92  Pulse: 70  Temp: 98.6 F (37 C)  TempSrc: Oral  SpO2: 97%  Weight: 238 lb 9.6 oz (108.2 kg)  Height: 6' (1.829 m)   EKG: Rate 62, axis normal, interval normal, sinus, no st or t wave changes, no significant change compared to prior 2019   This visit occurred during the  SARS-CoV-2 public health emergency.  Safety protocols were in place, including screening questions prior to the visit, additional usage of staff PPE, and extensive cleaning of exam room while observing appropriate contact time as indicated for disinfecting solutions.   Assessment & Plan:

## 2020-07-15 NOTE — Patient Instructions (Signed)
Your EKG is normal. We will do the blood work today so we can discuss this when you come back.

## 2020-07-16 NOTE — Assessment & Plan Note (Signed)
EKG done without change from prior. BP at goal on HCTZ 25 mg daily. Checking CMP and adjust as needed.

## 2020-07-16 NOTE — Assessment & Plan Note (Signed)
EKG done for chest tightness. Advised otc pepcid for symptoms if regular symptoms and tums or similar as needed if rare episodes.

## 2020-08-04 ENCOUNTER — Other Ambulatory Visit: Payer: Self-pay

## 2020-08-04 ENCOUNTER — Encounter: Payer: Self-pay | Admitting: Internal Medicine

## 2020-08-04 ENCOUNTER — Ambulatory Visit (INDEPENDENT_AMBULATORY_CARE_PROVIDER_SITE_OTHER): Payer: 59 | Admitting: Internal Medicine

## 2020-08-04 VITALS — BP 130/80 | HR 66 | Temp 98.6°F | Resp 18 | Ht 72.0 in | Wt 242.0 lb

## 2020-08-04 DIAGNOSIS — Z8042 Family history of malignant neoplasm of prostate: Secondary | ICD-10-CM

## 2020-08-04 DIAGNOSIS — Z0000 Encounter for general adult medical examination without abnormal findings: Secondary | ICD-10-CM | POA: Diagnosis not present

## 2020-08-04 DIAGNOSIS — I10 Essential (primary) hypertension: Secondary | ICD-10-CM

## 2020-08-04 DIAGNOSIS — R311 Benign essential microscopic hematuria: Secondary | ICD-10-CM | POA: Diagnosis not present

## 2020-08-04 DIAGNOSIS — E782 Mixed hyperlipidemia: Secondary | ICD-10-CM

## 2020-08-04 DIAGNOSIS — N029 Recurrent and persistent hematuria with unspecified morphologic changes: Secondary | ICD-10-CM

## 2020-08-04 LAB — URINALYSIS, ROUTINE W REFLEX MICROSCOPIC
Bilirubin Urine: NEGATIVE
Ketones, ur: NEGATIVE
Leukocytes,Ua: NEGATIVE
Nitrite: NEGATIVE
Specific Gravity, Urine: >=1.03 — AB (ref 1.000–1.030)
Total Protein, Urine: NEGATIVE
Urine Glucose: NEGATIVE
Urobilinogen, UA: 0.2 (ref 0.0–1.0)
pH: 6 (ref 5.0–8.0)

## 2020-08-04 MED ORDER — HYDROCHLOROTHIAZIDE 25 MG PO TABS: 25.0000 mg | ORAL_TABLET | Freq: Every day | ORAL | 3 refills | Status: DC

## 2020-08-04 MED ORDER — SIMVASTATIN 40 MG PO TABS: 40.0000 mg | ORAL_TABLET | Freq: Every day | ORAL | 3 refills | Status: DC

## 2020-08-04 NOTE — Assessment & Plan Note (Signed)
Flu shot yearly. Covid-19 3 shots. Shingrix complete. Tetanus up to date. Cologuard or colonoscopy due Oct 2022. Counseled about sun safety and mole surveillance. Counseled about the dangers of distracted driving. Given 10 year screening recommendations.

## 2020-08-04 NOTE — Assessment & Plan Note (Signed)
Reviewed recent lipid panel with him and refilled simvastatin 40 mg daily.

## 2020-08-04 NOTE — Patient Instructions (Signed)
End of October is when you are due for a colon cancer screening either the colonoscopy or the cologuard.

## 2020-08-04 NOTE — Assessment & Plan Note (Signed)
Checking U/A today. If persistent will offer referral to urology and discussed potential cystoscopy to evaluate fully if desired. We talked about how small hematuria is sometimes a false positive due to the testing.

## 2020-08-04 NOTE — Progress Notes (Signed)
   Subjective:   Patient ID: Theodore Whitney, male    DOB: 16-May-1965, 55 y.o.   MRN: [id number]  HPI The patient is a 55 YO man coming in for physical.   PMH, FMH, social history reviewed and updated  Review of Systems  Constitutional: Negative.   HENT: Negative.    Eyes: Negative.   Respiratory:  Negative for cough, chest tightness and shortness of breath.   Cardiovascular:  Negative for chest pain, palpitations and leg swelling.  Gastrointestinal:  Negative for abdominal distention, abdominal pain, constipation, diarrhea, nausea and vomiting.  Musculoskeletal: Negative.   Skin: Negative.   Neurological: Negative.   Psychiatric/Behavioral: Negative.     Objective:  Physical Exam Constitutional:      Appearance: He is well-developed.  HENT:     Head: Normocephalic and atraumatic.  Cardiovascular:     Rate and Rhythm: Normal rate and regular rhythm.  Pulmonary:     Effort: Pulmonary effort is normal. No respiratory distress.     Breath sounds: Normal breath sounds. No wheezing or rales.  Abdominal:     General: Bowel sounds are normal. There is no distension.     Palpations: Abdomen is soft.     Tenderness: There is no abdominal tenderness. There is no rebound.  Musculoskeletal:     Cervical back: Normal range of motion.  Skin:    General: Skin is warm and dry.  Neurological:     Mental Status: He is alert and oriented to person, place, and time.     Coordination: Coordination normal.    Vitals:   08/04/20 0902  BP: 130/80  Pulse: 66  Resp: 18  Temp: 98.6 F (37 C)  TempSrc: Oral  SpO2: 97%  Weight: 242 lb (109.8 kg)  Height: 6' (1.829 m)    This visit occurred during the SARS-CoV-2 public health emergency.  Safety protocols were in place, including screening questions prior to the visit, additional usage of staff PPE, and extensive cleaning of exam room while observing appropriate contact time as indicated for disinfecting solutions.   Assessment & Plan:

## 2020-08-04 NOTE — Assessment & Plan Note (Signed)
Refill HCTZ and reviewed labs with him today. BP at goal.

## 2020-08-04 NOTE — Assessment & Plan Note (Signed)
Reviewed recent PSA result with him.

## 2021-01-24 IMAGING — MR MR ABDOMEN WO/W CM
11 of 18 series · 27 of 48 positions shown · IV contrast (20ml Multihance)
Comparison: CTs including 05/06/2019

CLINICAL DATA: Generalized abdominal pain. CT demonstrating a right
hepatic lobe indeterminate lesion.

EXAM:
MRI ABDOMEN WITHOUT AND WITH CONTRAST
TECHNIQUE: Multiplanar multisequence MR imaging of the abdomen was performed
both before and after the administration of intravenous contrast.
CONTRAST:  20mL MULTIHANCE GADOBENATE DIMEGLUMINE 529 MG/ML IV SOLN
20 cc Gadavist

[Series 3: cor haste · coronal · 5.0mm · 0.74mm/px · 2 of 37 slices shown]
[im 1/37]
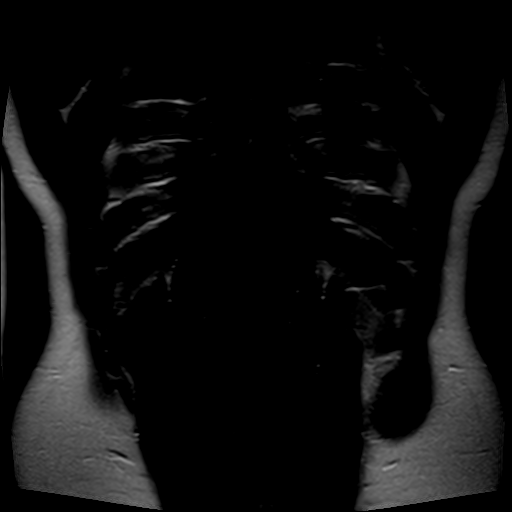
[im 37/37]
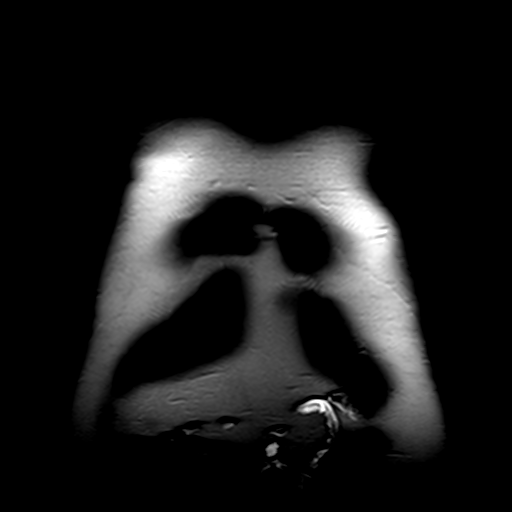

[Series 4: T1 · axial · 6.0mm · 0.78mm/px · z∈[-81,+123]mm · 3 of 64 slices shown]
[im 1/64]
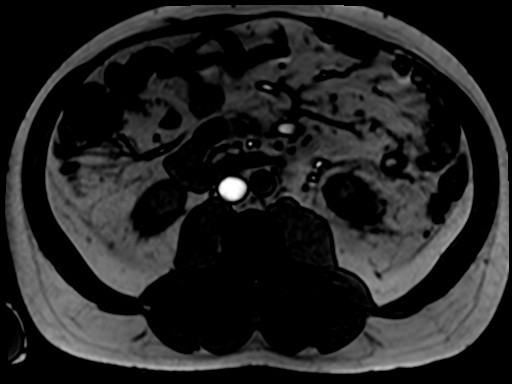
[im 32/64]
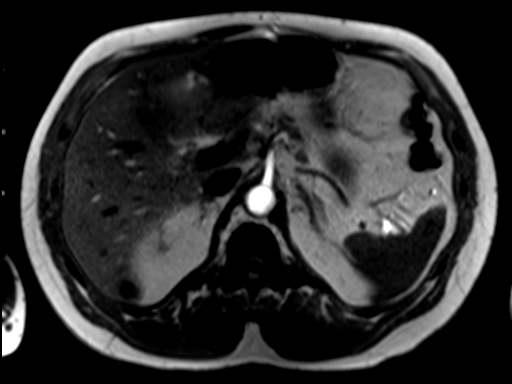
[im 64/64]
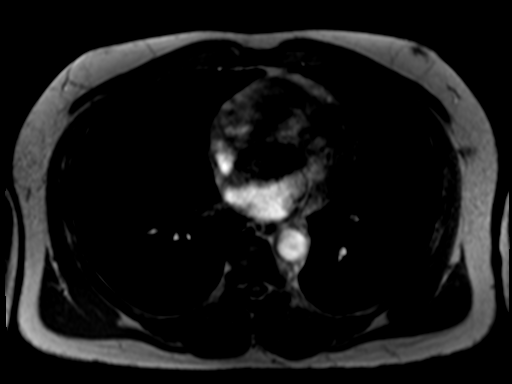

[Series 5: axial haste · axial · 6.0mm · 0.78mm/px · z∈[-89,+142]mm · 2 of 36 slices shown]
[im 1/36]
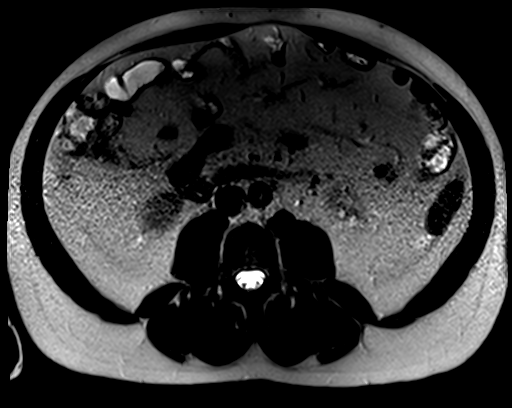
[im 36/36]
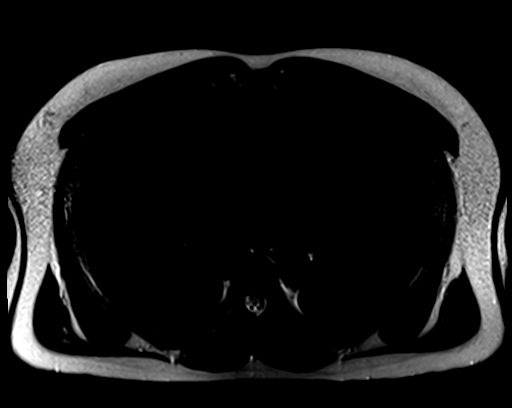

[Series 6: T2 · axial · 6.0mm · 1.19mm/px · z∈[-65,+172]mm · 2 of 34 slices shown]
[im 1/34]
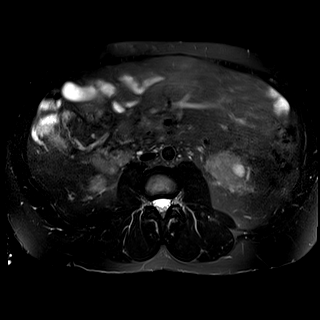
[im 34/34]
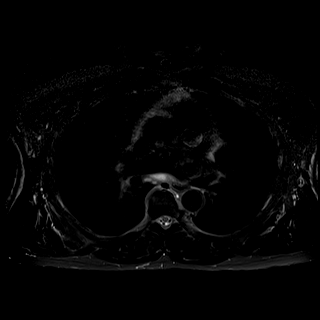

[Series 7: ep2d_diff_b50_500_800_p2_trig · axial · 6.0mm · 2.08mm/px · z∈[-65,+172]mm · 5 of 102 slices shown]
[im 1/102]
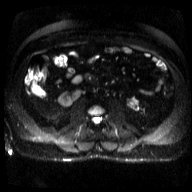
[im 26/102]
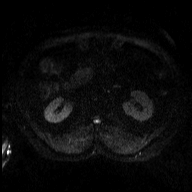
[im 51/102]
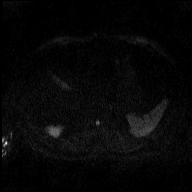
[im 76/102]
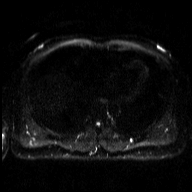
[im 102/102]
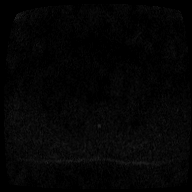

[Series 8: ep2d_[id number]_500_800_p2_trig_adc · axial · 6.0mm · 2.08mm/px · 1 of 34 slices shown]
[im 1/34]
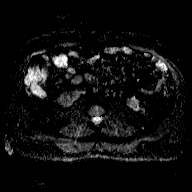

[Series 9: bSSFP · axial · 4.0mm · 0.74mm/px · 1 of 5 slices shown (1 of 2)]
[im 1/5]
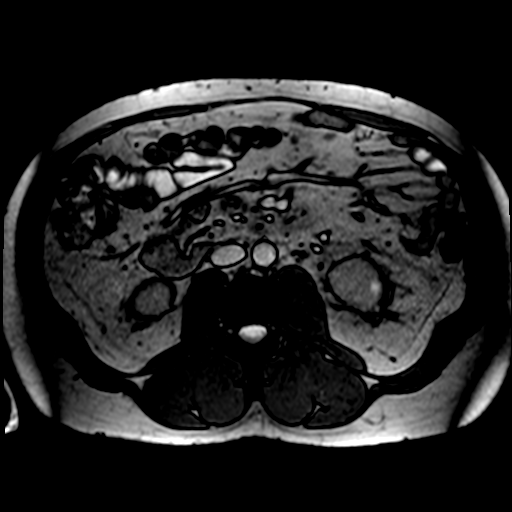

[Series 10: bSSFP · axial · 4.0mm · 0.74mm/px · z∈[-55,+121]mm · 2 of 45 slices shown (2 of 2)]
[im 1/45]
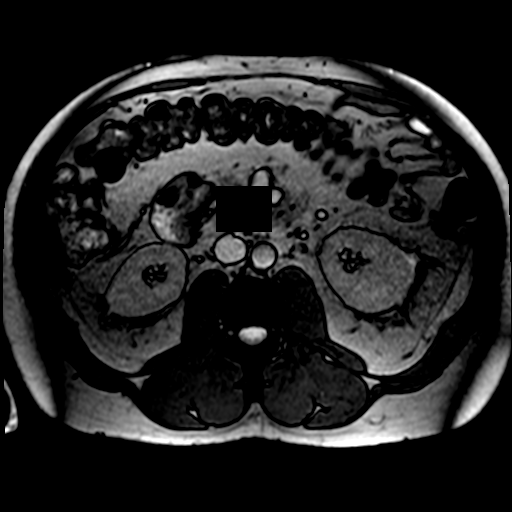
[im 45/45]
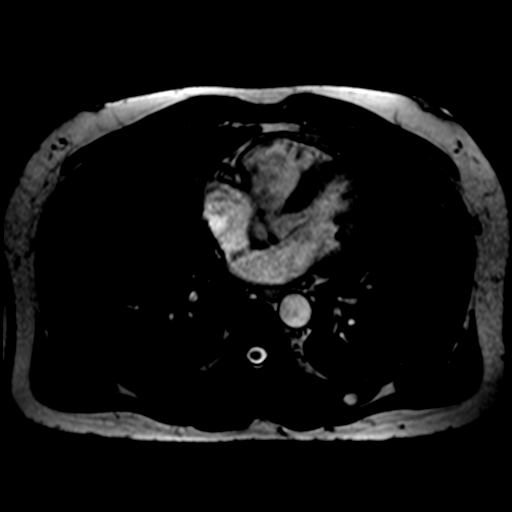

[Series 11: T1 dynamic · axial · non-contrast · 2.5mm · 0.78mm/px · z∈[-84,+133]mm · 3 of 88 slices shown]
[im 1/88]
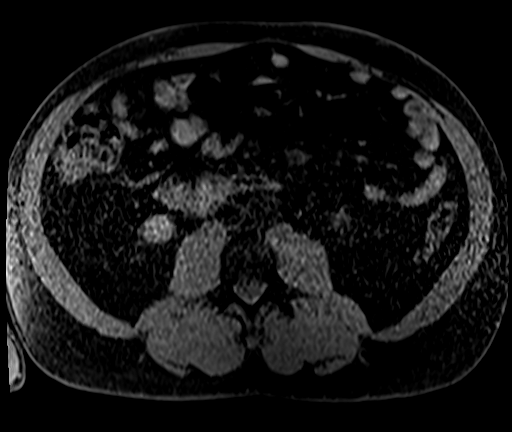
[im 44/88]
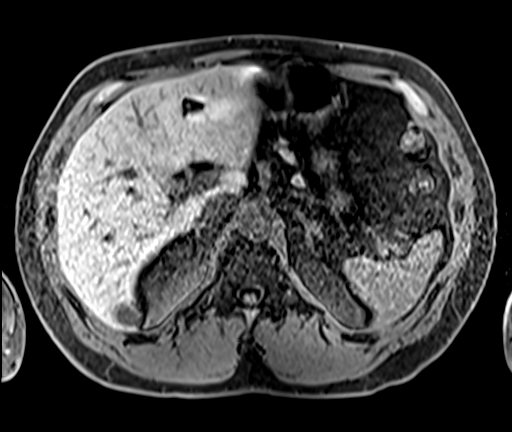
[im 88/88]
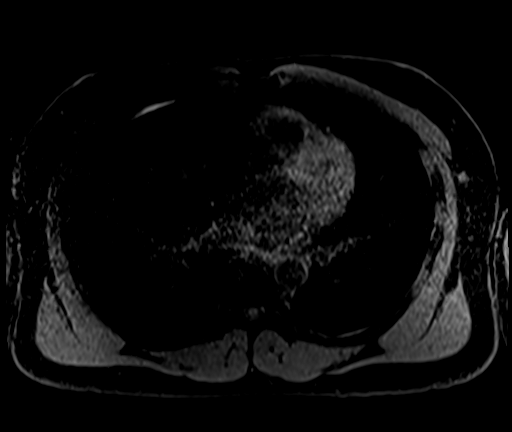

[Series 12: T1 dynamic post-contrast · axial · 2.5mm · 0.78mm/px · z∈[-84,+133]mm · 3 of 88 slices shown (1 of 2)]
[im 1/88]
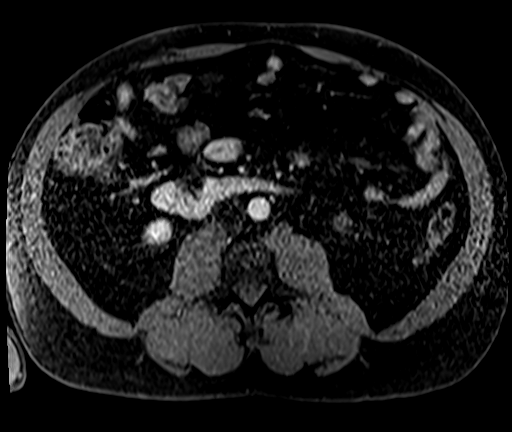
[im 44/88]
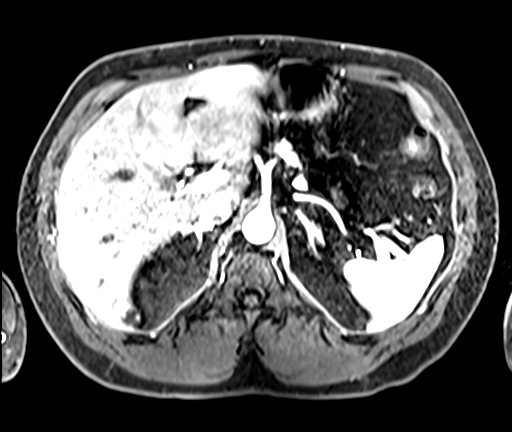
[im 88/88]
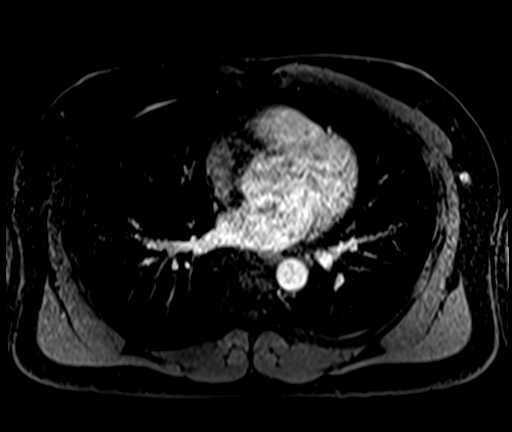

[Series 13: T1 dynamic post-contrast · axial · 2.5mm · 0.78mm/px · z∈[-84,+133]mm · 3 of 88 slices shown (2 of 2)]
[im 1/88]
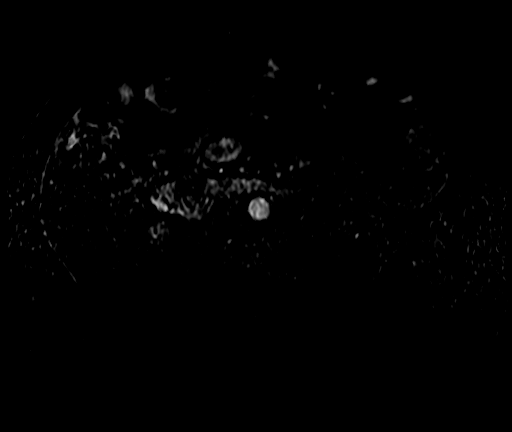
[im 44/88]
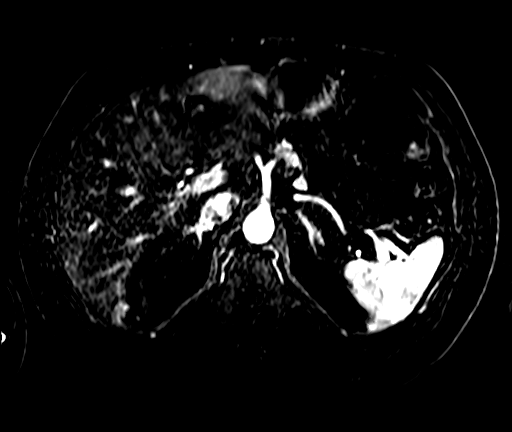
[im 88/88]
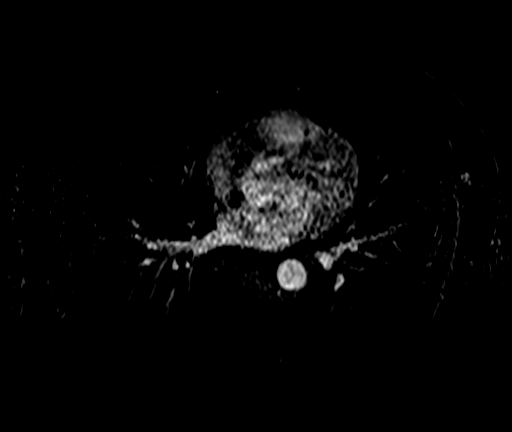

[27 of 48 positions shown; findings below may reference images not displayed]

FINDINGS: Lower chest: Normal heart size without pericardial or pleural
effusion.

Hepatobiliary: No cirrhosis. The CT abnormality corresponds to a
x 2.6 cm right hepatic (segment 7) lobe lesion on [DATE]. This
demonstrates peripheral nodular enhancement, including on 41/14,
consistent with a cavernous hemangioma.

Tiny hepatic cysts.  No suspicious liver lesion.

Normal gallbladder, without biliary ductal dilatation.

Pancreas:  Normal, without mass or ductal dilatation.

Spleen:  Normal in size, without focal abnormality.

Adrenals/Urinary Tract: Normal adrenal glands. Bilateral renal cysts
of maximally 1.2 cm. No suspicious renal lesion or evidence of
hydronephrosis.

Stomach/Bowel: Normal appearance of the stomach. Partial small-bowel
bowel non rotation, with lack of normal SMA/SMV relationship. The
terminal ileum and appendix are normal in position. No complicating
obstruction.

Vascular/Lymphatic: Otherwise normal appearance of the aorta and
branch vessels. No abdominal adenopathy.

Other:  No ascites.

Musculoskeletal: No acute osseous abnormality.
IMPRESSION: 1. Dominant right hepatic lobe lesion represents a cavernous
hemangioma.
2.  No acute abdominal process.
3. Partial small bowel non rotation, without evidence of
complicating obstruction.

## 2021-04-02 ENCOUNTER — Encounter: Payer: Self-pay | Admitting: Internal Medicine

## 2021-04-26 LAB — COLOGUARD: COLOGUARD: NEGATIVE

## 2021-05-30 ENCOUNTER — Other Ambulatory Visit: Payer: Self-pay | Admitting: Internal Medicine

## 2021-05-30 DIAGNOSIS — I10 Essential (primary) hypertension: Secondary | ICD-10-CM

## 2021-05-30 DIAGNOSIS — E782 Mixed hyperlipidemia: Secondary | ICD-10-CM

## 2021-08-17 ENCOUNTER — Other Ambulatory Visit: Payer: Self-pay | Admitting: Internal Medicine

## 2021-08-17 DIAGNOSIS — I10 Essential (primary) hypertension: Secondary | ICD-10-CM

## 2021-08-17 DIAGNOSIS — E782 Mixed hyperlipidemia: Secondary | ICD-10-CM

## 2021-09-30 ENCOUNTER — Other Ambulatory Visit: Payer: Self-pay | Admitting: Internal Medicine

## 2021-09-30 DIAGNOSIS — E782 Mixed hyperlipidemia: Secondary | ICD-10-CM

## 2021-09-30 DIAGNOSIS — I10 Essential (primary) hypertension: Secondary | ICD-10-CM

## 2021-10-27 ENCOUNTER — Encounter: Payer: Self-pay | Admitting: Internal Medicine

## 2021-10-27 ENCOUNTER — Ambulatory Visit (INDEPENDENT_AMBULATORY_CARE_PROVIDER_SITE_OTHER): Payer: 59 | Admitting: Internal Medicine

## 2021-10-27 VITALS — BP 152/90 | HR 84 | Temp 98.6°F | Ht 71.5 in | Wt 240.0 lb

## 2021-10-27 DIAGNOSIS — Z8042 Family history of malignant neoplasm of prostate: Secondary | ICD-10-CM

## 2021-10-27 DIAGNOSIS — Z0000 Encounter for general adult medical examination without abnormal findings: Secondary | ICD-10-CM | POA: Diagnosis not present

## 2021-10-27 DIAGNOSIS — E782 Mixed hyperlipidemia: Secondary | ICD-10-CM

## 2021-10-27 DIAGNOSIS — Z23 Encounter for immunization: Secondary | ICD-10-CM | POA: Diagnosis not present

## 2021-10-27 DIAGNOSIS — I10 Essential (primary) hypertension: Secondary | ICD-10-CM | POA: Diagnosis not present

## 2021-10-27 LAB — HEMOGLOBIN A1C: Hgb A1c MFr Bld: 6 % (ref 4.6–6.5)

## 2021-10-27 LAB — COMPREHENSIVE METABOLIC PANEL
ALT: 27 U/L (ref 0–53)
AST: 17 U/L (ref 0–37)
Albumin: 4.3 g/dL (ref 3.5–5.2)
Alkaline Phosphatase: 100 U/L (ref 39–117)
BUN: 19 mg/dL (ref 6–23)
CO2: 28 mEq/L (ref 19–32)
Calcium: 9.4 mg/dL (ref 8.4–10.5)
Chloride: 105 mEq/L (ref 96–112)
Creatinine, Ser: 1.19 mg/dL (ref 0.40–1.50)
GFR: 69.44 mL/min (ref >60.00)
Glucose, Bld: 132 mg/dL — ABNORMAL HIGH (ref 70–99)
Potassium: 4.1 mEq/L (ref 3.5–5.1)
Sodium: 140 mEq/L (ref 135–145)
Total Bilirubin: 0.7 mg/dL (ref 0.2–1.2)
Total Protein: 7 g/dL (ref 6.0–8.3)

## 2021-10-27 LAB — CBC
HCT: 39 % (ref 39.0–52.0)
Hemoglobin: 13.3 g/dL (ref 13.0–17.0)
MCHC: 34.1 g/dL (ref 30.0–36.0)
MCV: 96.4 fl (ref 78.0–100.0)
Platelets: 242 10*3/uL (ref 150.0–400.0)
RBC: 4.04 Mil/uL — ABNORMAL LOW (ref 4.22–5.81)
RDW: 13.4 % (ref 11.5–15.5)
WBC: 5.3 10*3/uL (ref 4.0–10.5)

## 2021-10-27 LAB — LIPID PANEL
Cholesterol: 160 mg/dL (ref 0–200)
HDL: 43.6 mg/dL (ref >39.00)
LDL Cholesterol: 98 mg/dL (ref 0–99)
NonHDL: 116.7
Total CHOL/HDL Ratio: 4
Triglycerides: 92 mg/dL (ref 0.0–149.0)
VLDL: 18.4 mg/dL (ref 0.0–40.0)

## 2021-10-27 LAB — PSA: PSA: 2.81 ng/mL (ref 0.10–4.00)

## 2021-10-27 NOTE — Assessment & Plan Note (Signed)
Checking PSA and adjust as needed. 

## 2021-10-27 NOTE — Assessment & Plan Note (Signed)
Flu shot given. Covid-19 counseled. Shingrix complete. Tetanus due will get at later date. Cologuard due 2026. Counseled about sun safety and mole surveillance. Counseled about the dangers of distracted driving. Given 10 year screening recommendations.

## 2021-10-27 NOTE — Assessment & Plan Note (Signed)
Checking lipid panel and adjust simvastatin 40 mg daily as needed.  

## 2021-10-27 NOTE — Progress Notes (Signed)
   Subjective:   Patient ID: Theodore Whitney, male    DOB: 06-23-1965, 56 y.o.   MRN: [id number]  HPI The patient is here for physical with new muscle pain right side.  PMH, [facility], social history reviewed and updated  Review of Systems  Constitutional: Negative.   HENT: Negative.    Eyes: Negative.   Respiratory:  Negative for cough, chest tightness and shortness of breath.   Cardiovascular:  Negative for chest pain, palpitations and leg swelling.  Gastrointestinal:  Positive for abdominal pain. Negative for abdominal distention, constipation, diarrhea, nausea and vomiting.  Musculoskeletal: Negative.   Skin: Negative.   Neurological: Negative.   Psychiatric/Behavioral: Negative.      Objective:  Physical Exam Constitutional:      Appearance: He is well-developed.  HENT:     Head: Normocephalic and atraumatic.  Cardiovascular:     Rate and Rhythm: Normal rate and regular rhythm.  Pulmonary:     Effort: Pulmonary effort is normal. No respiratory distress.     Breath sounds: Normal breath sounds. No wheezing or rales.  Abdominal:     General: Bowel sounds are normal. There is no distension.     Palpations: Abdomen is soft.     Tenderness: There is no abdominal tenderness. There is no rebound.  Musculoskeletal:     Cervical back: Normal range of motion.  Skin:    General: Skin is warm and dry.  Neurological:     Mental Status: He is alert and oriented to person, place, and time.     Coordination: Coordination normal.     Vitals:   10/27/21 0811 10/27/21 0816  BP: (!) 146/88 (!) 152/90  Pulse: 84   Temp: 98.6 F (37 C)   SpO2: 97%   Weight: 240 lb (108.9 kg)   Height: 5' 11.5" (1.816 m)     Assessment & Plan:  Flu shot given at visit

## 2021-10-27 NOTE — Assessment & Plan Note (Signed)
BP elevated has not taken meds due to fasting today. BP at goal at home. Checking CMP and adjust hctz 25 mg daily as needed.

## 2021-11-03 ENCOUNTER — Other Ambulatory Visit: Payer: Self-pay | Admitting: Internal Medicine

## 2021-11-03 DIAGNOSIS — R7303 Prediabetes: Secondary | ICD-10-CM | POA: Insufficient documentation

## 2021-11-15 ENCOUNTER — Encounter: Payer: 59 | Attending: Internal Medicine | Admitting: Dietician

## 2021-11-15 ENCOUNTER — Encounter: Payer: Self-pay | Admitting: Dietician

## 2021-11-15 DIAGNOSIS — I10 Essential (primary) hypertension: Secondary | ICD-10-CM | POA: Insufficient documentation

## 2021-11-15 DIAGNOSIS — E785 Hyperlipidemia, unspecified: Secondary | ICD-10-CM | POA: Insufficient documentation

## 2021-11-15 DIAGNOSIS — Z713 Dietary counseling and surveillance: Secondary | ICD-10-CM | POA: Diagnosis not present

## 2021-11-15 DIAGNOSIS — R7303 Prediabetes: Secondary | ICD-10-CM | POA: Diagnosis not present

## 2021-11-15 NOTE — Patient Instructions (Addendum)
Eat more Non-Starchy Vegetables  Minimize added sugars and refined grains Rethink what you drink. Choose beverages without added sugar. Look for 0 carbs on the label.  Choose whole foods over processed.  Make simple meals at home more often than eating out. Pack your lunch more often than going out.  Between your jobs try to opt for a balanced snack (carb+protein) instead of a honey bun.   Aim to drink 4 water bottles a day! -One before lunch, one after lunch, one at second job.   Tips for increasing water intake: -Keep a glass of water by your bedside so that you're encouraged to drink it upon waking -Carry a re-fillable water bottle -Add fruit such as lemon, lime, or berries to your water -Try sparkling water -Try adding flavor packets

## 2021-11-15 NOTE — Progress Notes (Signed)
Medical Nutrition Therapy  Appointment Start time:  [phone]  Appointment End time:  0901  Primary concerns today: pt states he wants to learn what to do to maintain or drop his A1c and wants to learn about lifestyle changes involving nutrition and exercise.    Referral diagnosis: prediabetes Preferred learning style: no preference indicated Learning readiness: ready   NUTRITION ASSESSMENT   Anthropometrics  Ht: 73in Wt: 240.9lbs  Clinical Medical Hx: HLD, HTN, prediabetes Medications: reviewed Labs: A1C 6.0 10/27/21 Notable Signs/Symptoms: none  Lifestyle & Dietary Hx  Pt is present today with his wife.  Pt works 2 jobs, which makes getting exercise in difficult. Pt has flexible lunch break and states he may be able to walk for 15 minutes after lunch some days. Pt is currently only exercising by taking his dog on a 1 mile walk on the weekends.   Pt packs lunch for work more often than eating out, but occasionally pt will get mcdonalds big mac and fries or zaxbys salad.  Pt states he recently changed diet since his a1c came back in the prediabetes range. Pt states he was previously having soda 2x/day and sweets. Pt still drinks sugar sweetened beverages.  Pt gets home from work at 9pm and eats dinner at 9:30pm. Pt goes to bed at 11:30pm. Pt's wife states they eat at home during the week and go out to eat on weekends.   Estimated daily fluid intake: 32 oz Supplements: none Sleep: 6 hours Stress / self-care: mild stress Current average weekly physical activity: walks dog 15-20 minutes 2x/wk.   24-Hr Dietary Recall First Meal: orange juice Snack: none Second Meal: 12pm: chicken with rice and vegetables OR mcdonalds  Snack: sandwich and chips OR honey bun OR nab crackers  Third Meal: 9:30pm: chicken, rice, cream spinach or collards  Snack: cookies OR honey bun Beverages: orange juice, sweet tea with lemon, cran-grape juice   NUTRITION DIAGNOSIS  NB-1.1 Food and  nutrition-related knowledge deficit As related to prediabetes.  As evidenced by pt report and diet history.   NUTRITION INTERVENTION  Nutrition education (E-1) on the following topics:  Prediabetes Insulin resistance Balance of carbohydrate, protein, fruits and non-starchy vegetables Saturated fat intake Benefits of physical activity Importance of adequate sleep Effect of sugary drinks on blood sugar Building balanced snacks Importance of hydration  Handouts Provided Include  Dish Up a Healthy Meal Snack Suggestions  Learning Style & Readiness for Change Teaching method utilized: Visual & Auditory  Demonstrated degree of understanding via: Teach Back  Barriers to learning/adherence to lifestyle change: none  Goals Established by Pt Eat more Non-Starchy Vegetables  Minimize added sugars and refined grains Rethink what you drink. Choose beverages without added sugar. Look for 0 carbs on the label.  Choose whole foods over processed.  Make simple meals at home more often than eating out. Pack your lunch more often than going out.  Between your jobs try to opt for a balanced snack (carb+protein) instead of a honey bun.   Aim to drink 4 water bottles a day! -One before lunch, one after lunch, one at second job.    MONITORING & EVALUATION Dietary intake, weekly physical activity, and follow up in 2 months.  Next Steps  Patient is to call for questions.

## 2022-01-21 ENCOUNTER — Encounter: Payer: 59 | Attending: Internal Medicine | Admitting: Dietician

## 2022-01-21 ENCOUNTER — Encounter: Payer: Self-pay | Admitting: Dietician

## 2022-01-21 VITALS — Ht 73.0 in | Wt 230.4 lb

## 2022-01-21 DIAGNOSIS — R7303 Prediabetes: Secondary | ICD-10-CM | POA: Diagnosis present

## 2022-01-21 NOTE — Progress Notes (Signed)
Medical Nutrition Therapy  Appointment Start time:  [phone]  Appointment End time:  0810  Primary concerns today: pt states he wants to learn what to do to maintain or drop his A1c and wants to learn about lifestyle changes involving nutrition and exercise.    Referral diagnosis: prediabetes Preferred learning style: no preference indicated Learning readiness: ready   NUTRITION ASSESSMENT   Anthropometrics  Ht: 73in Wt 11/15/21: 240.9lbs Wt 01/21/22: 230.4lbs  Clinical Medical Hx: HLD, HTN, prediabetes Medications: reviewed Labs: A1C 6.0 10/27/21 Notable Signs/Symptoms: none  Lifestyle & Dietary Hx  Pt has 10 lbs wt loss x2 months.   Pt states he has reduced his intake of sweets and candy and he switched his snacks of honey buns to apple sauce and fruit cups or peanut butter crackers.   Pt reports he has increased his water intake. He now drinks 48-64 oz, and previously had 32 oz.  Pt gets half and half sweet tea and pt has been packing leftovers for lunch more often.   Pt has stationary bike and wants to start using it.   Estimated daily fluid intake: 48-64 oz Supplements: none Sleep: 6 hours Stress / self-care: mild stress Current average weekly physical activity: walks dog 15-20 minutes 2x/wk.   24-Hr Dietary Recall First Meal: [employer] biscuit and half and half sweet tea OR scrambled eggs and bacon and [employer] toast Snack: none Second Meal: 12pm: leftovers chicken with rice and vegetables OR zaxbys salad OR burger king Snack: fruit cup, peanut butter crackers  Third Meal: 9:30pm: chicken, rice, cream spinach or collards  Snack: none Beverages: 3-4 water bottles, half and half sweet tea, occasional juice  NUTRITION DIAGNOSIS  NB-1.1 Food and nutrition-related knowledge deficit As related to prediabetes.  As evidenced by pt report and diet history.   NUTRITION INTERVENTION  Nutrition education (E-1) on the following topics:  Artificial sweeteners Including a protein  at snacks Meal options when eating out Physical activity goals and benefits Hydration status and needs  Handouts Provided Include  Surviving the Holidays  Learning Style & Readiness for Change Teaching method utilized: Visual & Auditory  Demonstrated degree of understanding via: Teach Back  Barriers to learning/adherence to lifestyle change: none  Goals Established by Pt Minimize added sugars and refined grains Rethink what you drink. Choose beverages without added sugar. Look for 0 carbs on the label.  Make simple meals at home more often than eating out. Pack your lunch more often than going out.  Between your jobs try to opt for a balanced snack (carb+protein) instead of a honey bun.   New Goals: Goal: Use the stationary bike for 20 minutes 3 days a week.  Goal: Drink 4 water bottles daily. Drink consistently throughout the day.  Aim to get a english muffin instead of biscuit at breakfast.   MONITORING & EVALUATION Dietary intake, weekly physical activity, and follow up in 3 months.  Next Steps  Patient is to call for questions.

## 2022-01-21 NOTE — Patient Instructions (Signed)
New Goals:  Goal: Use the stationary bike for 20 minutes 3 days a week.   Goal: Drink 4 water bottles daily. Drink consistently throughout the day.   Aim to get a english muffin instead of biscuit at breakfast.

## 2022-04-11 ENCOUNTER — Ambulatory Visit: Payer: 59 | Admitting: Dietician

## 2022-07-18 ENCOUNTER — Telehealth: Payer: Self-pay | Admitting: Internal Medicine

## 2022-07-18 NOTE — Telephone Encounter (Signed)
Prescription Request  07/18/2022  LOV: 10/27/2021  What is the name of the medication or equipment? hydrochlorothiazide (HYDRODIURIL) 25 MG tablet   simvastatin (ZOCOR) 40 MG tablet [   Have you contacted your pharmacy to request a refill? No   Which pharmacy would you like this sent to?    [facility] [place], [place] - [address] [facility] [address] [place] [address] [place]  [address] Phone: [phone] Fax: [phone]  Patient notified that their request is being sent to the clinical staff for review and that they should receive a response within 2 business days.   Please advise at Mobile [phone] (mobile)

## 2022-07-26 NOTE — Telephone Encounter (Signed)
Patient states that he only has a day or two left of these medications - please send in refill

## 2022-07-27 ENCOUNTER — Other Ambulatory Visit: Payer: Self-pay

## 2022-07-27 DIAGNOSIS — E782 Mixed hyperlipidemia: Secondary | ICD-10-CM

## 2022-07-27 MED ORDER — HYDROCHLOROTHIAZIDE 25 MG PO TABS: 25.0000 mg | ORAL_TABLET | Freq: Every day | ORAL | 0 refills | Status: DC

## 2022-07-27 NOTE — Telephone Encounter (Signed)
Refill has been sent in.  

## 2022-08-03 ENCOUNTER — Other Ambulatory Visit: Payer: Self-pay | Admitting: Internal Medicine

## 2022-08-03 DIAGNOSIS — I10 Essential (primary) hypertension: Secondary | ICD-10-CM

## 2022-09-15 ENCOUNTER — Encounter (INDEPENDENT_AMBULATORY_CARE_PROVIDER_SITE_OTHER): Payer: Self-pay

## 2022-09-27 ENCOUNTER — Other Ambulatory Visit: Payer: Self-pay | Admitting: Internal Medicine

## 2022-09-27 DIAGNOSIS — E782 Mixed hyperlipidemia: Secondary | ICD-10-CM

## 2022-10-14 ENCOUNTER — Other Ambulatory Visit: Payer: Self-pay | Admitting: Internal Medicine

## 2022-10-14 DIAGNOSIS — E782 Mixed hyperlipidemia: Secondary | ICD-10-CM

## 2022-10-31 ENCOUNTER — Encounter: Payer: Self-pay | Admitting: Internal Medicine

## 2022-10-31 ENCOUNTER — Ambulatory Visit (INDEPENDENT_AMBULATORY_CARE_PROVIDER_SITE_OTHER): Payer: 59 | Admitting: Internal Medicine

## 2022-10-31 VITALS — BP 138/82 | HR 79 | Temp 98.9°F | Ht 73.0 in | Wt 237.0 lb

## 2022-10-31 DIAGNOSIS — I10 Essential (primary) hypertension: Secondary | ICD-10-CM

## 2022-10-31 DIAGNOSIS — Z23 Encounter for immunization: Secondary | ICD-10-CM | POA: Diagnosis not present

## 2022-10-31 DIAGNOSIS — R7303 Prediabetes: Secondary | ICD-10-CM

## 2022-10-31 DIAGNOSIS — R10A1 Flank pain, right side: Secondary | ICD-10-CM | POA: Insufficient documentation

## 2022-10-31 DIAGNOSIS — E782 Mixed hyperlipidemia: Secondary | ICD-10-CM | POA: Diagnosis not present

## 2022-10-31 DIAGNOSIS — R109 Unspecified abdominal pain: Secondary | ICD-10-CM | POA: Insufficient documentation

## 2022-10-31 DIAGNOSIS — Z8042 Family history of malignant neoplasm of prostate: Secondary | ICD-10-CM

## 2022-10-31 DIAGNOSIS — Z0000 Encounter for general adult medical examination without abnormal findings: Secondary | ICD-10-CM | POA: Diagnosis not present

## 2022-10-31 LAB — COMPREHENSIVE METABOLIC PANEL
ALT: 22 U/L (ref 0–53)
AST: 15 U/L (ref 0–37)
Albumin: 4.2 g/dL (ref 3.5–5.2)
Alkaline Phosphatase: 99 U/L (ref 39–117)
BUN: 18 mg/dL (ref 6–23)
CO2: 28 meq/L (ref 19–32)
Calcium: 9.3 mg/dL (ref 8.4–10.5)
Chloride: 104 meq/L (ref 96–112)
Creatinine, Ser: 1.18 mg/dL (ref 0.40–1.50)
GFR: 69.65 mL/min (ref >60.00)
Glucose, Bld: 132 mg/dL — ABNORMAL HIGH (ref 70–99)
Potassium: 4 meq/L (ref 3.5–5.1)
Sodium: 139 meq/L (ref 135–145)
Total Bilirubin: 0.5 mg/dL (ref 0.2–1.2)
Total Protein: 6.8 g/dL (ref 6.0–8.3)

## 2022-10-31 LAB — HEMOGLOBIN A1C: Hgb A1c MFr Bld: 5.8 % (ref 4.6–6.5)

## 2022-10-31 LAB — LIPID PANEL
Cholesterol: 155 mg/dL (ref 0–200)
HDL: 41.4 mg/dL (ref >39.00)
LDL Cholesterol: 90 mg/dL (ref 0–99)
NonHDL: 113.34
Total CHOL/HDL Ratio: 4
Triglycerides: 119 mg/dL (ref 0.0–149.0)
VLDL: 23.8 mg/dL (ref 0.0–40.0)

## 2022-10-31 LAB — PSA: PSA: 4.25 ng/mL — ABNORMAL HIGH (ref 0.10–4.00)

## 2022-10-31 LAB — CBC
HCT: 40.8 % (ref 39.0–52.0)
Hemoglobin: 13.5 g/dL (ref 13.0–17.0)
MCHC: 33.1 g/dL (ref 30.0–36.0)
MCV: 98.5 fL (ref 78.0–100.0)
Platelets: 280 10*3/uL (ref 150.0–400.0)
RBC: 4.14 Mil/uL — ABNORMAL LOW (ref 4.22–5.81)
RDW: 13.3 % (ref 11.5–15.5)
WBC: 5.6 10*3/uL (ref 4.0–10.5)

## 2022-10-31 MED ORDER — SIMVASTATIN 40 MG PO TABS
40.0000 mg | ORAL_TABLET | Freq: Every day | ORAL | 3 refills | Status: AC
Start: 2022-10-31 — End: ?

## 2022-10-31 MED ORDER — HYDROCHLOROTHIAZIDE 25 MG PO TABS
25.0000 mg | ORAL_TABLET | Freq: Every day | ORAL | 3 refills | Status: DC
Start: 2022-10-31 — End: 2023-10-18

## 2022-10-31 NOTE — Assessment & Plan Note (Signed)
Checking Hga1c and adjust as needed.  

## 2022-10-31 NOTE — Assessment & Plan Note (Signed)
Flu shot declines today. Shingrix complete. Tetanus given. Cologuard up to date. Counseled about sun safety and mole surveillance. Counseled about the dangers of distracted driving. Given 10 year screening recommendations.

## 2022-10-31 NOTE — Assessment & Plan Note (Signed)
Checking PSA and no symptoms.

## 2022-10-31 NOTE — Assessment & Plan Note (Signed)
Checking lipid panel and adjust simvastatin 40 mg daily as needed.  

## 2022-10-31 NOTE — Assessment & Plan Note (Signed)
Checking CMP and adjust as needed. BP at goal on hydrochlorothiazide 25 mg daily.

## 2022-10-31 NOTE — Progress Notes (Signed)
Subjective:   Patient ID: Theodore Whitney, male    DOB: Jun 29, 1965, 57 y.o.   MRN: [id number]  HPI The patient is here for physical.  PMH, [facility], social history reviewed and updated  Review of Systems  Constitutional: Negative.   HENT: Negative.    Eyes: Negative.   Respiratory:  Negative for cough, chest tightness and shortness of breath.   Cardiovascular:  Negative for chest pain, palpitations and leg swelling.  Gastrointestinal:  Negative for abdominal distention, abdominal pain, constipation, diarrhea, nausea and vomiting.  Musculoskeletal: Negative.   Skin: Negative.   Neurological: Negative.   Psychiatric/Behavioral: Negative.      Objective:  Physical Exam Constitutional:      Appearance: He is well-developed.  HENT:     Head: Normocephalic and atraumatic.  Cardiovascular:     Rate and Rhythm: Normal rate and regular rhythm.  Pulmonary:     Effort: Pulmonary effort is normal. No respiratory distress.     Breath sounds: Normal breath sounds. No wheezing or rales.  Abdominal:     General: Bowel sounds are normal. There is no distension.     Palpations: Abdomen is soft.     Tenderness: There is no abdominal tenderness. There is no rebound.  Musculoskeletal:     Cervical back: Normal range of motion.  Skin:    General: Skin is warm and dry.  Neurological:     Mental Status: He is alert and oriented to person, place, and time.     Coordination: Coordination normal.     Vitals:   10/31/22 0825 10/31/22 0827 10/31/22 0838  BP: (!) 142/80 (!) 142/80 138/82  Pulse: 79    Temp: 98.9 F (37.2 C)    TempSrc: Oral    SpO2: 97%    Weight: 237 lb (107.5 kg)    Height: 6\' 1"  (1.854 m)      Assessment & Plan:  Tdap given at visit

## 2022-10-31 NOTE — Assessment & Plan Note (Signed)
Chronic and intermittent. Checking [place] RUQ to assess for gallstones as possible cause.

## 2022-11-04 ENCOUNTER — Telehealth: Payer: Self-pay | Admitting: Internal Medicine

## 2022-11-04 DIAGNOSIS — R972 Elevated prostate specific antigen [PSA]: Secondary | ICD-10-CM

## 2022-11-04 NOTE — Telephone Encounter (Signed)
Patient called and said he and Dr. Okey Dupre discussed referring him to a urologist. He said he would like to go through with that. Patient would like a call back at [phone].

## 2022-11-07 ENCOUNTER — Ambulatory Visit
Admission: RE | Admit: 2022-11-07 | Discharge: 2022-11-07 | Disposition: A | Discharge status: Home / Self Care | Payer: 59 | Source: Ambulatory Visit | Attending: Internal Medicine | Admitting: Internal Medicine

## 2022-11-07 DIAGNOSIS — R109 Unspecified abdominal pain: Secondary | ICD-10-CM

## 2022-11-07 NOTE — Telephone Encounter (Signed)
Have placed referral 

## 2023-01-19 ENCOUNTER — Other Ambulatory Visit: Payer: Self-pay | Admitting: Urology

## 2023-01-19 DIAGNOSIS — R972 Elevated prostate specific antigen [PSA]: Secondary | ICD-10-CM

## 2023-02-13 ENCOUNTER — Ambulatory Visit
Admission: RE | Admit: 2023-02-13 | Discharge: 2023-02-13 | Disposition: A | Discharge status: Home / Self Care | Payer: 59 | Source: Ambulatory Visit | Attending: Urology | Admitting: Urology

## 2023-02-13 DIAGNOSIS — R972 Elevated prostate specific antigen [PSA]: Secondary | ICD-10-CM

## 2023-02-13 MED ORDER — GADOPICLENOL 0.5 MMOL/ML IV SOLN: 10.0000 mL | Freq: Once | INTRAVENOUS | Status: DC | PRN

## 2023-02-24 ENCOUNTER — Ambulatory Visit
Admission: RE | Admit: 2023-02-24 | Discharge: 2023-02-24 | Disposition: A | Discharge status: Home / Self Care | Payer: 59 | Source: Ambulatory Visit | Attending: Urology

## 2023-02-24 DIAGNOSIS — R972 Elevated prostate specific antigen [PSA]: Secondary | ICD-10-CM

## 2023-02-24 MED ORDER — GADOPICLENOL 0.5 MMOL/ML IV SOLN
10.0000 mL | Freq: Once | INTRAVENOUS | Status: AC | PRN
  Administered 2023-02-24: 10 mL via INTRAVENOUS

## 2023-04-28 ENCOUNTER — Telehealth: Payer: Self-pay | Admitting: Radiation Oncology

## 2023-04-28 NOTE — Telephone Encounter (Signed)
 Called patient to schedule a consultation w. Dr. Kathrynn Running. Patient requested to be seen on a Monday, patient scheduled as advised.

## 2023-05-04 ENCOUNTER — Encounter: Payer: Self-pay | Admitting: Radiation Oncology

## 2023-05-04 NOTE — Progress Notes (Signed)
 GU Location of Tumor / Histology: Prostate Ca  If Prostate Cancer, Gleason Score is (3 + 3) and PSA is (3.81 on 01/16/2023)  PSA 4.25 on 10/31/2022 PSA 2.81 on 10/27/2021  Theodore Whitney presented as referral from Dr. Yevonne Heman [facility]) elevated PSA.  Biopsies     02/24/2023 Dr. Veryl Gottron [name] MR Prostate with/without Contrast CLINICAL DATA: Elevated PSA level. R97.20   IMPRESSION: 1. PI-RADS category 4 lesion of the right peripheral zone and PI-RADS category 3 lesion of the left transition zone. Targeting data sent to [facility]. 2. Mild prostatomegaly and benign prostatic hypertrophy.    Past/Anticipated interventions by urology, if any:     Past/Anticipated interventions by medical oncology, if any: NA  Weight changes, if any:  No  IPSS:  6 SHIM:  25  Bowel/Bladder complaints, if any:  No  Nausea/Vomiting, if any:   No  Pain issues, if any:  0/10  SAFETY ISSUES: Prior radiation? No Pacemaker/ICD?  No Possible current pregnancy? Male Is the patient on methotrexate? No  Current Complaints / other details:
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
## 2023-05-15 ENCOUNTER — Encounter: Payer: Self-pay | Admitting: Radiation Oncology

## 2023-05-15 ENCOUNTER — Ambulatory Visit
Admission: RE | Admit: 2023-05-15 | Discharge: 2023-05-15 | Disposition: A | Discharge status: Home / Self Care | Source: Ambulatory Visit | Attending: Radiation Oncology | Admitting: Radiation Oncology

## 2023-05-15 VITALS — BP 160/95 | HR 72 | Temp 97.7°F | Resp 18 | Ht 73.0 in | Wt 241.5 lb

## 2023-05-15 DIAGNOSIS — C61 Malignant neoplasm of prostate: Secondary | ICD-10-CM | POA: Diagnosis present

## 2023-05-15 DIAGNOSIS — Z8546 Personal history of malignant neoplasm of prostate: Secondary | ICD-10-CM | POA: Insufficient documentation

## 2023-05-15 HISTORY — DX: Elevated prostate specific antigen (PSA): R97.20

## 2023-05-15 NOTE — Progress Notes (Signed)
 Introduced myself to the patient as the prostate nurse navigator.  No barriers to care identified at this time.  He is here to discuss his radiation treatment options and will have a surgical consult with Dr. Rozanne Corners on 4/22.  I gave him my business card and asked him to call me with questions or concerns.  Verbalized understanding. Patient agreeable for follow up after surgical consult.

## 2023-05-29 NOTE — Progress Notes (Signed)
 RN reached out to patient to follow up after rad onc consult on 4/14, and surgical consult on 4/22.   Patient remains undecided at this time and plans to review with insurance regarding deductible prior to finalizing treatment decision.  Patient denies any questions at this time, feels very well informed.  Patient will reach out once he finalizes treatment decision.

## 2023-06-15 NOTE — Progress Notes (Signed)
 RN spoke with patient to follow up to ensure no additional questions or if he had reached a treatment decision.   Patient is still taking time to consider all his options, and also voiced he would like to possibly get a second opinion before finalizing. Patient aware that we will gladly help coordinate for second opinion, he will let me know if he needs assistance.  Patient agreeable for continued follow up.

## 2023-07-03 NOTE — Progress Notes (Signed)
 Left message for call back to review treatment decision.

## 2023-07-14 NOTE — Progress Notes (Signed)
 RN left message for call back to review treatment decisions or review for any additional questions prior to finalizing treatment decision.

## 2023-07-27 NOTE — Progress Notes (Signed)
 RN left message for call back to assess and additional questions, or treatment decisions.

## 2023-08-15 ENCOUNTER — Other Ambulatory Visit: Payer: Self-pay | Admitting: Urology

## 2023-08-15 NOTE — Progress Notes (Signed)
 Patient is proceed with surgical intervention for his stage T1c adenocarcinoma of the prostate with Gleason score of 3+3, and PSA of 3.81.   Patient is established with a treatment plan and is actively engaged in care. Nurse Navigator services not currently indicated at this time. Will re-evaluate if needs change or if additional support is requested.

## 2023-09-06 NOTE — Progress Notes (Addendum)
 PCP - Almarie Cleveland, MD Cardiologist - n/a  PPM/ICD -  Device Orders -  Rep Notified -   Chest x-ray -  EKG - preop Stress Test -  ECHO -  Cardiac Cath -   Sleep Study -  CPAP -   Fasting Blood Sugar -  Checks Blood Sugar ___0__ times a day  Blood Thinner Instructions:n/a Aspirin Instructions:n/a   COVID vaccine -yes  Activity--Able to climb a flight of stairs with no CP or SOB Anesthesia review: HTN , pre-DM  Patient denies shortness of breath, fever, cough and chest pain at PAT appointment   All instructions explained to the patient, with a verbal understanding of the material. Patient agrees to go over the instructions while at home for a better understanding. Patient also instructed to self quarantine after being tested for COVID-19. The opportunity to ask questions was provided.

## 2023-09-06 NOTE — Patient Instructions (Addendum)
 SURGICAL WAITING ROOM VISITATION  Patients having surgery or a procedure may have no more than 2 support people in the waiting area - these visitors may rotate.    Children under the age of 39 must have an adult with them who is not the patient.  Visitors with respiratory illnesses are discouraged from visiting and should remain at home.  If the patient needs to stay at the hospital during part of their recovery, the visitor guidelines for inpatient rooms apply. Pre-op nurse will coordinate an appropriate time for 1 support person to accompany patient in pre-op.  This support person may not rotate.    Please refer to the [facility] website for the visitor guidelines for Inpatients (after your surgery is over and you are in a regular room).       Your procedure is scheduled on: 09-18-23   Report to [facility] Main Entrance    Report to admitting at     0900 AM   Call this number if you have problems the morning of surgery [phone]   Do not eat food   OR DRINK LIQUIDS :After Midnight.                If you have questions, please contact your surgeon's office.   FOLLOW BOWEL PREP AND ANY ADDITIONAL PRE OP INSTRUCTIONS YOU RECEIVED FROM YOUR SURGEON'S OFFICE!!!                         ONE 8 OZ BOTTLE OF MAGNESIUM CITRATE AT NOON DAY BEFORE SURGERY              ONE FLEETS ENEMA THE NIGHT BEFORE SURGERY   Oral Hygiene is also important to reduce your risk of infection.                                    Remember - BRUSH YOUR TEETH THE MORNING OF SURGERY WITH YOUR REGULAR TOOTHPASTE  DENTURES WILL BE REMOVED PRIOR TO SURGERY PLEASE DO NOT APPLY Poly grip OR ADHESIVES!!!   Do NOT smoke after Midnight   Stop all vitamins and herbal supplements 7 days before surgery.   Take these medicines the morning of surgery with A SIP OF WATER: none  DO NOT TAKE ANY ORAL DIABETIC MEDICATIONS DAY OF YOUR SURGERY  Bring CPAP mask and tubing day of surgery.                               You may not have any metal on your body including hair pins, jewelry, and body piercing             Do not wear  lotions, powders, /cologne, or deodorant                 Men may shave face and neck.   Do not bring valuables to the hospital. [facility] IS NOT             RESPONSIBLE   FOR VALUABLES.   Contacts, glasses, dentures or bridgework may not be worn into surgery.   Bring small overnight bag day of surgery.   DO NOT BRING YOUR HOME MEDICATIONS TO THE HOSPITAL. PHARMACY WILL DISPENSE MEDICATIONS LISTED ON YOUR MEDICATION LIST TO YOU DURING YOUR ADMISSION IN THE HOSPITAL!    Patients discharged on the  day of surgery will not be allowed to drive home.  Someone NEEDS to stay with you for the first 24 hours after anesthesia.   Special Instructions: Bring a copy of your healthcare power of attorney and living will documents the day of surgery if you haven't scanned them before.              Please read over the following fact sheets you were given: IF YOU HAVE QUESTIONS ABOUT YOUR PRE-OP INSTRUCTIONS PLEASE CALL [phone].     . If you test positive for Covid or have been in contact with anyone that has tested positive in the last 10 days please notify you surgeon.    [facility] - Preparing for Surgery Before surgery, you can play an important role.  Because skin is not sterile, your skin needs to be as free of germs as possible.  You can reduce the number of germs on your skin by washing with CHG (chlorahexidine gluconate) soap before surgery.  CHG is an antiseptic cleaner which kills germs and bonds with the skin to continue killing germs even after washing. Please DO NOT use if you have an allergy to CHG or antibacterial soaps.  If your skin becomes reddened/irritated stop using the CHG and inform your nurse when you arrive at Short Stay. Do not shave (including legs and underarms) for at least 48 hours prior to the first CHG shower.  You may shave your  face/neck. Please follow these instructions carefully:  1.  Shower with CHG Soap the night before surgery and the  morning of Surgery.  2.  If you choose to wash your hair, wash your hair first as usual with your  normal  shampoo.  3.  After you shampoo, rinse your hair and body thoroughly to remove the  shampoo.                            4.  Use CHG as you would any other liquid soap.  You can apply chg directly  to the skin and wash                       Gently with a scrungie or clean washcloth.  5.  Apply the CHG Soap to your body ONLY FROM THE NECK DOWN.   Do not use on face/ open                           Wound or open sores. Avoid contact with eyes, ears mouth and genitals (private parts).                       Wash face,  Genitals (private parts) with your normal soap.             6.  Wash thoroughly, paying special attention to the area where your surgery  will be performed.  7.  Thoroughly rinse your body with warm water  from the neck down.  8.  DO NOT shower/wash with your normal soap after using and rinsing off  the CHG Soap.                9.  Pat yourself dry with a clean towel.            10.  Wear clean pajamas.            11.  Place clean sheets on your bed the night of your first shower and do not  sleep with pets. Day of Surgery : Do not apply any lotions/deodorants the morning of surgery.  Please wear clean clothes to the hospital/surgery center.  FAILURE TO FOLLOW THESE INSTRUCTIONS MAY RESULT IN THE CANCELLATION OF YOUR SURGERY PATIENT SIGNATURE_________________________________  NURSE SIGNATURE__________________________________  ________________________________________________________________________ WHAT IS A BLOOD TRANSFUSION? Blood Transfusion Information  A transfusion is the replacement of blood or some of its parts. Blood is made up of multiple cells which provide different functions. Red blood cells carry oxygen and are used for blood loss replacement. White  blood cells fight against infection. Platelets control bleeding. Plasma helps clot blood. Other blood products are available for specialized needs, such as hemophilia or other clotting disorders. BEFORE THE TRANSFUSION  Who gives blood for transfusions?  Healthy volunteers who are fully evaluated to make sure their blood is safe. This is blood bank blood. Transfusion therapy is the safest it has ever been in the practice of medicine. Before blood is taken from a donor, a complete history is taken to make sure that person has no history of diseases nor engages in risky social behavior (examples are intravenous drug use or sexual activity with multiple partners). The donor's travel history is screened to minimize risk of transmitting infections, such as malaria. The donated blood is tested for signs of infectious diseases, such as HIV and hepatitis. The blood is then tested to be sure it is compatible with you in order to minimize the chance of a transfusion reaction. If you or a relative donates blood, this is often done in anticipation of surgery and is not appropriate for emergency situations. It takes many days to process the donated blood. RISKS AND COMPLICATIONS Although transfusion therapy is very safe and saves many lives, the main dangers of transfusion include:  Getting an infectious disease. Developing a transfusion reaction. This is an allergic reaction to something in the blood you were given. Every precaution is taken to prevent this. The decision to have a blood transfusion has been considered carefully by your caregiver before blood is given. Blood is not given unless the benefits outweigh the risks. AFTER THE TRANSFUSION Right after receiving a blood transfusion, you will usually feel much better and more energetic. This is especially true if your red blood cells have gotten low (anemic). The transfusion raises the level of the red blood cells which carry oxygen, and this usually causes  an energy increase. The nurse administering the transfusion will monitor you carefully for complications. HOME CARE INSTRUCTIONS  No special instructions are needed after a transfusion. You may find your energy is better. Speak with your caregiver about any limitations on activity for underlying diseases you may have. SEEK MEDICAL CARE IF:  Your condition is not improving after your transfusion. You develop redness or irritation at the intravenous (IV) site. SEEK IMMEDIATE MEDICAL CARE IF:  Any of the following symptoms occur over the next 12 hours: Shaking chills. You have a temperature by mouth above 102 F (38.9 C), not controlled by medicine. Chest, back, or muscle pain. People around you feel you are not acting correctly or are confused. Shortness of breath or difficulty breathing. Dizziness and fainting. You get a rash or develop hives. You have a decrease in urine output. Your urine turns a dark color or changes to pink, red, or brown. Any of the following symptoms occur over the next 10 days: You have a temperature by mouth  above 102 F (38.9 C), not controlled by medicine. Shortness of breath. Weakness after normal activity. The white part of the eye turns yellow (jaundice). You have a decrease in the amount of urine or are urinating less often. Your urine turns a dark color or changes to pink, red, or brown. Document Released: 01/15/2000 Document Revised: 04/11/2011 Document Reviewed: 09/03/2007 [facility] Patient Information 2014 [place], [place].  _______________________________________________________________________

## 2023-09-11 ENCOUNTER — Other Ambulatory Visit: Payer: Self-pay

## 2023-09-11 ENCOUNTER — Encounter (HOSPITAL_COMMUNITY)
Admission: RE | Admit: 2023-09-11 | Discharge: 2023-09-11 | Disposition: A | Discharge status: Home / Self Care | Source: Ambulatory Visit | Attending: Urology | Admitting: Urology

## 2023-09-11 ENCOUNTER — Encounter (HOSPITAL_COMMUNITY): Payer: Self-pay

## 2023-09-11 VITALS — BP 146/85 | HR 91 | Temp 99.1°F | Resp 16 | Ht 73.0 in | Wt 232.0 lb

## 2023-09-11 DIAGNOSIS — Z01812 Encounter for preprocedural laboratory examination: Secondary | ICD-10-CM | POA: Diagnosis present

## 2023-09-11 DIAGNOSIS — I10 Essential (primary) hypertension: Secondary | ICD-10-CM | POA: Insufficient documentation

## 2023-09-11 DIAGNOSIS — Z01818 Encounter for other preprocedural examination: Secondary | ICD-10-CM | POA: Insufficient documentation

## 2023-09-11 DIAGNOSIS — Z01810 Encounter for preprocedural cardiovascular examination: Secondary | ICD-10-CM | POA: Diagnosis present

## 2023-09-11 HISTORY — DX: Prediabetes: R73.03

## 2023-09-11 HISTORY — DX: Malignant (primary) neoplasm, unspecified: C80.1

## 2023-09-11 LAB — CBC
HCT: 40.9 % (ref 39.0–52.0)
Hemoglobin: 13.6 g/dL (ref 13.0–17.0)
MCH: 31.9 pg (ref 26.0–34.0)
MCHC: 33.3 g/dL (ref 30.0–36.0)
MCV: 96 fL (ref 80.0–100.0)
Platelets: 281 K/uL (ref 150–400)
RBC: 4.26 MIL/uL (ref 4.22–5.81)
RDW: 12.3 % (ref 11.5–15.5)
WBC: 5.8 K/uL (ref 4.0–10.5)
nRBC: 0 % (ref 0.0–0.2)

## 2023-09-11 LAB — BASIC METABOLIC PANEL WITH GFR
Anion gap: 10 (ref 5–15)
BUN: 16 mg/dL (ref 6–20)
CO2: 25 mmol/L (ref 22–32)
Calcium: 9.5 mg/dL (ref 8.9–10.3)
Chloride: 102 mmol/L (ref 98–111)
Creatinine, Ser: 0.99 mg/dL (ref 0.61–1.24)
GFR, Estimated: >60 mL/min (ref >60)
Glucose, Bld: 211 mg/dL — ABNORMAL HIGH (ref 70–99)
Potassium: 3.5 mmol/L (ref 3.5–5.1)
Sodium: 137 mmol/L (ref 135–145)

## 2023-09-15 NOTE — H&P (Signed)
 Office Visit Report     09/04/2023   --------------------------------------------------------------------------------   Theodore Whitney  MRN: [id number]  DOB: 11-04-1965, 58 year old Male  SSN:    PRIMARY CARE:  Almarie Cleveland  PRIMARY CARE FAX:  [phone]  REFERRING:  Almarie Cleveland  PROVIDER:  Lonni Han, M.D.  TREATING:  Ubaldo Eagles, NP  LOCATION:  [facility] [phone]     --------------------------------------------------------------------------------   CC/HPI: 09/04/2023: Patient seen today accompanied by his wife for preoperative visit prior to undergoing bilateral nerve sparing robot-assisted laparoscopic radical prostatectomy with Dr. Renda on 8/18. Patient has already been seen and evaluated by pelvic floor PT in anticipation of his postoperative recovery. He has been diligent with exercises at home. Patient denies any changes in past medical history, prescription medications taken on daily basis, no interval surgical or procedural intervention. Patient continues to void at his baseline with grossly stable nonbothersome symptomology including absence of any dysuria or gross hematuria. He denies any recent chest pain, shortness of breath, fever/chills or nausea/vomiting.   CC: Prostate Cancer   Physician requesting consult: Dr. Lonni Han  PCP: Dr. Almarie Cleveland   Theodore Whitney is a 58 year old gentleman who was found to have a persistently elevated PSA of 3.81 prompting an MRI of the prostate on 02/24/23 that indicated a 3.2 cm PI-RADS 4 lesion of the right peripheral zone from the apex to the base and a 1.4 cm PI-RADS 3 lesion of the left anterior transition zone. 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 He has been counseled by both Dr. Han and Dr. Patrcia  prior to his appointment today.   Family history: He has 2 brothers that were diagnosed with prostate cancer in their 53s. One was treated with a robotic prostatectomy and the other was treated with a radiation seed implant.   Imaging studies: MRI (02/24/23) - No EPE, SVI, LAD, or bone lesions.   PMH: He has a history of hypertension and hyperlipidemia.  PSH: No abdominal surgeries.   TNM stage: cT1c N0 Mx  PSA: 3.81  Gleason score: 3+3=6 (GG 1)  Biopsy (04/17/23): 8/19 cores positive  Left: L lateral apex (5%, 3+3=6), L apex (40%, 3+3=6), L lateral base (10%, 3+3=6)  Right: R mid (40%, 3+3=6), R lateral mid (40%, 3+3=6)  ROI-1: 2/4 cores positive (3+3=6, 5% and 5%)  ROI-2: 1/3 cores positive (3+3=6, 5%)  Prostate volume: 53 cc  PSAD: 0.07   Nomogram  OC disease: 83%  EPE: 17%  SVI: 1%  LNI: 1%  PFS (5 year, 10 year): 95%, 90%   Urinary function: IPSS is 1.  Erectile function: SHIM score is 25.     ALLERGIES: No Allergies    MEDICATIONS: hydroCHLOROthiazide  25 MG Tablet tablet  levoFLOXacin 750 MG Tablet 1 tablet PO As Directed Take one hour prior to your scheduled procedure.  Simvastatin      GU PSH: Prostate Needle Biopsy - 04/17/2023     NON-GU PSH: Knee Orthosis, Derotation, Medial-lateral, Anterior Cruciate Ligament, Custom Fabricated, Right Surgical Pathology, Gross And Microscopic Examination For Prostate Needle - 04/17/2023     GU PMH: Stress Incontinence - 09/04/2023 Prostate Cancer - 08/22/2023, - 05/23/2023, - 04/27/2023 Family Hx of Prostate Cancer - 04/27/2023, - 04/17/2023, - 01/16/2023 Elevated PSA - 04/17/2023, -  01/16/2023    NON-GU PMH: Muscle weakness (generalized) - 09/04/2023, - 08/22/2023 Other muscle spasm - 09/04/2023 Hypercholesterolemia Hypertension    FAMILY HISTORY: 1 Daughter - Daughter Kidney Failure - Father Prostate Cancer - Brother   SOCIAL HISTORY: Marital Status: Married Preferred Language: English; Ethnicity: Not Hispanic Or Latino;  Race: Black or African American Current Smoking Status: Patient has never smoked.   Tobacco Use Assessment Completed: Used Tobacco in last 30 days? Does not use smokeless tobacco. Does drink.  Drinks 2 caffeinated drinks per day. Patient's occupation Network engineer.    REVIEW OF SYSTEMS:    GU Review Male:   Patient denies frequent urination, hard to postpone urination, burning/ pain with urination, get up at night to urinate, leakage of urine, stream starts and stops, trouble starting your stream, have to strain to urinate , erection problems, and penile pain.  Gastrointestinal (Upper):   Patient denies nausea, vomiting, and indigestion/ heartburn.  Gastrointestinal (Lower):   Patient denies diarrhea and constipation.  Constitutional:   Patient denies fever, night sweats, weight loss, and fatigue.  Skin:   Patient denies skin rash/ lesion and itching.  Eyes:   Patient denies blurred vision and double vision.  Ears/ Nose/ Throat:   Patient denies sore throat and sinus problems.  Hematologic/Lymphatic:   Patient denies swollen glands and easy bruising.  Cardiovascular:   Patient denies leg swelling and chest pains.  Respiratory:   Patient denies cough and shortness of breath.  Endocrine:   Patient denies excessive thirst.  Musculoskeletal:   Patient denies back pain and joint pain.  Neurological:   Patient denies headaches and dizziness.  Psychologic:   Patient denies depression and anxiety.   VITAL SIGNS:      09/04/2023 02:07 PM  Pulse 71 /min  Temperature 97.8 F / 36.5 C   MULTI-SYSTEM PHYSICAL EXAMINATION:    Constitutional: Well-nourished. No physical deformities. Normally developed. Good grooming.  Neck: Neck symmetrical, not swollen. Normal tracheal position.  Respiratory: No labored breathing, no use of accessory muscles.   Cardiovascular: Normal temperature, normal extremity pulses, no swelling, no varicosities.  Skin: No paleness, no jaundice, no cyanosis.  No lesion, no ulcer, no rash.  Neurologic / Psychiatric: Oriented to time, oriented to place, oriented to person. No depression, no anxiety, no agitation.  Gastrointestinal: Small Umbilical hernia. No mass, no tenderness, no rigidity, non obese abdomen.   Musculoskeletal: Normal gait and station of head and neck.     Complexity of Data:  Source Of History:  Patient, Medical Record Summary  Lab Test Review:   PSA  Records Review:   Pathology Reports, Previous Doctor Records, Previous Hospital Records, Previous Patient Records  Urine Test Review:   Urinalysis   01/16/23  PSA  Total PSA 3.81 ng/mL    09/04/23  Urinalysis  Urine Appearance Clear   Urine Color Yellow   Urine Glucose Neg mg/dL  Urine Bilirubin Neg mg/dL  Urine Ketones Neg mg/dL  Urine Specific Gravity 1.010   Urine Blood Trace ery/uL  Urine pH 6.0   Urine Protein Neg mg/dL  Urine Urobilinogen 0.2 mg/dL  Urine Nitrites Neg   Urine Leukocyte Esterase Neg leu/uL  Urine WBC/hpf NS (Not Seen)   Urine RBC/hpf 0 - 2/hpf   Urine Epithelial Cells NS (Not Seen)   Urine Bacteria NS (Not Seen)   Urine Mucous Not Present   Urine Yeast NS (Not Seen)   Urine Trichomonas Not Present   Urine Cystals NS (Not Seen)  Urine Casts NS (Not Seen)   Urine Sperm Not Present    PROCEDURES:          Urinalysis w/Scope Dipstick Dipstick Cont'd Micro  Color: Yellow Bilirubin: Neg mg/dL WBC/hpf: NS (Not Seen)  Appearance: Clear Ketones: Neg mg/dL RBC/hpf: 0 - 2/hpf  Specific Gravity: 1.010 Blood: Trace ery/uL Bacteria: NS (Not Seen)  pH: 6.0 Protein: Neg mg/dL Cystals: NS (Not Seen)  Glucose: Neg mg/dL Urobilinogen: 0.2 mg/dL Casts: NS (Not Seen)    Nitrites: Neg Trichomonas: Not Present    Leukocyte Esterase: Neg leu/uL Mucous: Not Present      Epithelial Cells: NS (Not Seen)      Yeast: NS (Not Seen)      Sperm: Not Present    ASSESSMENT:      ICD-10 Details  1 GU:   Prostate Cancer - C61 Chronic, Threat to Bodily Function   2 NON-GU:   Encounter for other preprocedural examination - Z01.818 Undiagnosed New Problem   PLAN:           Orders Labs Urine Culture          Schedule Return Visit/Planned Activity: Keep Scheduled Appointment - Follow up MD, Schedule Surgery          Document Letter(s):  Created for Patient: Clinical Summary         Notes:   All questions answered to the best of my ability regarding the upcoming procedure expected postoperative course allergic expressed by the patient and his wife. Urine culture was sent today to serve as a precautionary baseline. He will proceed with previously scheduled bladder prostatectomy with Dr. Renda on 8/18.        Next Appointment:      Next Appointment: 09/18/2023 11:15 AM    Appointment Type: Surgery     Location: [facility] [phone]    Provider: Gretel Renda, M.D.    Reason for Visit: WL/OBS RALP LEV 2 AND BPLND WITH RESIDENT      * Signed by Ubaldo Eagles, NP on 09/04/23 at 2:59 PM (EDT)*

## 2023-09-18 ENCOUNTER — Other Ambulatory Visit: Payer: Self-pay

## 2023-09-18 ENCOUNTER — Ambulatory Visit (HOSPITAL_COMMUNITY): Payer: Self-pay | Admitting: Medical

## 2023-09-18 ENCOUNTER — Encounter (HOSPITAL_COMMUNITY): Payer: Self-pay | Admitting: Urology

## 2023-09-18 ENCOUNTER — Ambulatory Visit (HOSPITAL_BASED_OUTPATIENT_CLINIC_OR_DEPARTMENT_OTHER): Admitting: Anesthesiology

## 2023-09-18 ENCOUNTER — Observation Stay (HOSPITAL_COMMUNITY)
Admission: RE | Admit: 2023-09-18 | Discharge: 2023-09-19 | Disposition: A | Discharge status: Home / Self Care | Source: Ambulatory Visit | Attending: Urology | Admitting: Urology

## 2023-09-18 ENCOUNTER — Encounter (HOSPITAL_COMMUNITY)
Admission: RE | Disposition: A | Discharge status: Home / Self Care | Payer: Self-pay | Source: Ambulatory Visit | Attending: Urology

## 2023-09-18 DIAGNOSIS — I10 Essential (primary) hypertension: Secondary | ICD-10-CM | POA: Diagnosis not present

## 2023-09-18 DIAGNOSIS — Z720 Tobacco use: Secondary | ICD-10-CM | POA: Diagnosis not present

## 2023-09-18 DIAGNOSIS — C61 Malignant neoplasm of prostate: Principal | ICD-10-CM | POA: Insufficient documentation

## 2023-09-18 DIAGNOSIS — K429 Umbilical hernia without obstruction or gangrene: Secondary | ICD-10-CM | POA: Insufficient documentation

## 2023-09-18 DIAGNOSIS — Z8546 Personal history of malignant neoplasm of prostate: Secondary | ICD-10-CM | POA: Diagnosis present

## 2023-09-18 HISTORY — PX: ROBOT ASSISTED LAPAROSCOPIC RADICAL PROSTATECTOMY: SHX5141

## 2023-09-18 LAB — ABO/RH: ABO/RH(D): O POS

## 2023-09-18 LAB — TYPE AND SCREEN
ABO/RH(D): O POS
Antibody Screen: NEGATIVE

## 2023-09-18 LAB — HEMOGLOBIN AND HEMATOCRIT, BLOOD
HCT: 33.3 % — ABNORMAL LOW (ref 39.0–52.0)
Hemoglobin: 11.1 g/dL — ABNORMAL LOW (ref 13.0–17.0)

## 2023-09-18 SURGERY — PROSTATECTOMY, RADICAL, ROBOT-ASSISTED, LAPAROSCOPIC
Anesthesia: General

## 2023-09-18 MED ORDER — FENTANYL CITRATE (PF) 100 MCG/2ML IJ SOLN
INTRAMUSCULAR | Status: AC
  Filled 2023-09-18: qty 2

## 2023-09-18 MED ORDER — DIPHENHYDRAMINE HCL 50 MG/ML IJ SOLN: 12.5000 mg | Freq: Four times a day (QID) | INTRAMUSCULAR | Status: DC | PRN

## 2023-09-18 MED ORDER — MIDAZOLAM HCL 2 MG/2ML IJ SOLN
INTRAMUSCULAR | Status: AC
  Filled 2023-09-18: qty 2

## 2023-09-18 MED ORDER — TRIPLE ANTIBIOTIC 3.5-400-5000 EX OINT: 1.0000 | TOPICAL_OINTMENT | Freq: Three times a day (TID) | CUTANEOUS | Status: DC | PRN

## 2023-09-18 MED ORDER — LACTATED RINGERS IV SOLN: INTRAVENOUS | Status: DC

## 2023-09-18 MED ORDER — HYDROMORPHONE HCL 1 MG/ML IJ SOLN
INTRAMUSCULAR | Status: DC | PRN
  Administered 2023-09-18 (×2): .5 mg via INTRAVENOUS
  Administered 2023-09-18: 1 mg via INTRAVENOUS

## 2023-09-18 MED ORDER — HYDROCHLOROTHIAZIDE 25 MG PO TABS
25.0000 mg | ORAL_TABLET | Freq: Every day | ORAL | Status: DC
  Administered 2023-09-18 – 2023-09-19 (×2): 25 mg via ORAL
  Filled 2023-09-18 (×2): qty 1

## 2023-09-18 MED ORDER — OXYCODONE HCL 5 MG PO TABS: 5.0000 mg | ORAL_TABLET | Freq: Once | ORAL | Status: DC | PRN

## 2023-09-18 MED ORDER — ORAL CARE MOUTH RINSE: 15.0000 mL | OROMUCOSAL | Status: DC | PRN

## 2023-09-18 MED ORDER — ROCURONIUM BROMIDE 10 MG/ML (PF) SYRINGE
PREFILLED_SYRINGE | INTRAVENOUS | Status: AC
  Filled 2023-09-18: qty 10

## 2023-09-18 MED ORDER — AMISULPRIDE (ANTIEMETIC) 5 MG/2ML IV SOLN: 10.0000 mg | Freq: Once | INTRAVENOUS | Status: DC | PRN

## 2023-09-18 MED ORDER — BUPIVACAINE-EPINEPHRINE 0.25% -1:200000 IJ SOLN
INTRAMUSCULAR | Status: DC | PRN
  Administered 2023-09-18: 30 mL

## 2023-09-18 MED ORDER — SUGAMMADEX SODIUM 200 MG/2ML IV SOLN
INTRAVENOUS | Status: AC
  Filled 2023-09-18: qty 2

## 2023-09-18 MED ORDER — SODIUM CHLORIDE 0.9 % IV BOLUS
1000.0000 mL | Freq: Once | INTRAVENOUS | Status: AC
  Administered 2023-09-18: 1000 mL via INTRAVENOUS

## 2023-09-18 MED ORDER — PROPOFOL 10 MG/ML IV BOLUS
INTRAVENOUS | Status: AC
  Filled 2023-09-18: qty 20

## 2023-09-18 MED ORDER — HEMOSTATIC AGENTS (NO CHARGE) OPTIME
TOPICAL | Status: DC | PRN
  Administered 2023-09-18: 1 via TOPICAL

## 2023-09-18 MED ORDER — LIDOCAINE HCL (CARDIAC) PF 100 MG/5ML IV SOSY
PREFILLED_SYRINGE | INTRAVENOUS | Status: DC | PRN
  Administered 2023-09-18: 60 mg via INTRAVENOUS

## 2023-09-18 MED ORDER — HEPARIN SODIUM (PORCINE) 1000 UNIT/ML IJ SOLN
INTRAMUSCULAR | Status: AC
  Filled 2023-09-18: qty 1

## 2023-09-18 MED ORDER — ACETAMINOPHEN 10 MG/ML IV SOLN: 1000.0000 mg | Freq: Once | INTRAVENOUS | Status: DC | PRN

## 2023-09-18 MED ORDER — FENTANYL CITRATE (PF) 100 MCG/2ML IJ SOLN
INTRAMUSCULAR | Status: DC | PRN
  Administered 2023-09-18: 100 ug via INTRAVENOUS

## 2023-09-18 MED ORDER — CEFAZOLIN SODIUM-DEXTROSE 2-4 GM/100ML-% IV SOLN
2.0000 g | INTRAVENOUS | Status: AC
  Administered 2023-09-18: 2 g via INTRAVENOUS
  Filled 2023-09-18: qty 100

## 2023-09-18 MED ORDER — ROCURONIUM BROMIDE 100 MG/10ML IV SOLN
INTRAVENOUS | Status: DC | PRN
  Administered 2023-09-18: 60 mg via INTRAVENOUS
  Administered 2023-09-18: 10 mg via INTRAVENOUS
  Administered 2023-09-18 (×2): 20 mg via INTRAVENOUS

## 2023-09-18 MED ORDER — STERILE WATER FOR IRRIGATION IR SOLN
Status: DC | PRN
  Administered 2023-09-18: 1000 mL

## 2023-09-18 MED ORDER — EPHEDRINE SULFATE-NACL 50-0.9 MG/10ML-% IV SOSY
PREFILLED_SYRINGE | INTRAVENOUS | Status: DC | PRN
  Administered 2023-09-18: 5 mg via INTRAVENOUS

## 2023-09-18 MED ORDER — ZOLPIDEM TARTRATE 5 MG PO TABS: 5.0000 mg | ORAL_TABLET | Freq: Every evening | ORAL | Status: DC | PRN

## 2023-09-18 MED ORDER — ALBUMIN HUMAN 5 % IV SOLN: INTRAVENOUS | Status: DC | PRN

## 2023-09-18 MED ORDER — LACTATED RINGERS IV SOLN: INTRAVENOUS | Status: DC | PRN

## 2023-09-18 MED ORDER — MIDAZOLAM HCL 5 MG/5ML IJ SOLN
INTRAMUSCULAR | Status: DC | PRN
  Administered 2023-09-18: 2 mg via INTRAVENOUS

## 2023-09-18 MED ORDER — ACETAMINOPHEN 10 MG/ML IV SOLN
INTRAVENOUS | Status: AC
  Filled 2023-09-18: qty 100

## 2023-09-18 MED ORDER — ONDANSETRON HCL 4 MG/2ML IJ SOLN
INTRAMUSCULAR | Status: AC
  Filled 2023-09-18: qty 2

## 2023-09-18 MED ORDER — SIMVASTATIN 40 MG PO TABS
40.0000 mg | ORAL_TABLET | Freq: Every day | ORAL | Status: DC
  Administered 2023-09-18: 40 mg via ORAL
  Filled 2023-09-18: qty 1

## 2023-09-18 MED ORDER — HYOSCYAMINE SULFATE 0.125 MG SL SUBL: 0.1250 mg | SUBLINGUAL_TABLET | Freq: Four times a day (QID) | SUBLINGUAL | Status: DC | PRN

## 2023-09-18 MED ORDER — CHLORHEXIDINE GLUCONATE 0.12 % MT SOLN
15.0000 mL | Freq: Once | OROMUCOSAL | Status: AC
  Administered 2023-09-18: 15 mL via OROMUCOSAL

## 2023-09-18 MED ORDER — BUPIVACAINE-EPINEPHRINE (PF) 0.25% -1:200000 IJ SOLN
INTRAMUSCULAR | Status: AC
  Filled 2023-09-18: qty 30

## 2023-09-18 MED ORDER — DOCUSATE SODIUM 100 MG PO CAPS
100.0000 mg | ORAL_CAPSULE | Freq: Two times a day (BID) | ORAL | Status: DC
  Administered 2023-09-18 – 2023-09-19 (×2): 100 mg via ORAL
  Filled 2023-09-18 (×2): qty 1

## 2023-09-18 MED ORDER — MORPHINE SULFATE (PF) 2 MG/ML IV SOLN: 2.0000 mg | INTRAVENOUS | Status: DC | PRN

## 2023-09-18 MED ORDER — LIDOCAINE HCL (PF) 2 % IJ SOLN
INTRAMUSCULAR | Status: AC
  Filled 2023-09-18: qty 5

## 2023-09-18 MED ORDER — ONDANSETRON HCL 4 MG/2ML IJ SOLN
INTRAMUSCULAR | Status: DC | PRN
  Administered 2023-09-18: 4 mg via INTRAVENOUS

## 2023-09-18 MED ORDER — CEFAZOLIN SODIUM-DEXTROSE 1-4 GM/50ML-% IV SOLN
1.0000 g | Freq: Three times a day (TID) | INTRAVENOUS | Status: AC
  Administered 2023-09-18 – 2023-09-19 (×2): 1 g via INTRAVENOUS
  Filled 2023-09-18 (×2): qty 50

## 2023-09-18 MED ORDER — PROPOFOL 10 MG/ML IV BOLUS
INTRAVENOUS | Status: DC | PRN
  Administered 2023-09-18: 200 mg via INTRAVENOUS

## 2023-09-18 MED ORDER — PHENYLEPHRINE HCL-NACL 20-0.9 MG/250ML-% IV SOLN
INTRAVENOUS | Status: DC | PRN
  Administered 2023-09-18: 15 ug/min via INTRAVENOUS

## 2023-09-18 MED ORDER — DEXAMETHASONE SODIUM PHOSPHATE 10 MG/ML IJ SOLN
INTRAMUSCULAR | Status: DC | PRN
  Administered 2023-09-18: 10 mg via INTRAVENOUS

## 2023-09-18 MED ORDER — OXYCODONE HCL 5 MG/5ML PO SOLN: 5.0000 mg | Freq: Once | ORAL | Status: DC | PRN

## 2023-09-18 MED ORDER — DIPHENHYDRAMINE HCL 12.5 MG/5ML PO ELIX: 12.5000 mg | ORAL_SOLUTION | Freq: Four times a day (QID) | ORAL | Status: DC | PRN

## 2023-09-18 MED ORDER — SUGAMMADEX SODIUM 200 MG/2ML IV SOLN
INTRAVENOUS | Status: DC | PRN
  Administered 2023-09-18: 200 mg via INTRAVENOUS

## 2023-09-18 MED ORDER — PHENYLEPHRINE 80 MCG/ML (10ML) SYRINGE FOR IV PUSH (FOR BLOOD PRESSURE SUPPORT)
PREFILLED_SYRINGE | INTRAVENOUS | Status: DC | PRN
  Administered 2023-09-18: 80 ug via INTRAVENOUS
  Administered 2023-09-18: 160 ug via INTRAVENOUS
  Administered 2023-09-18 (×3): 80 ug via INTRAVENOUS

## 2023-09-18 MED ORDER — ALBUMIN HUMAN 5 % IV SOLN
INTRAVENOUS | Status: AC
  Filled 2023-09-18: qty 250

## 2023-09-18 MED ORDER — FENTANYL CITRATE PF 50 MCG/ML IJ SOSY
PREFILLED_SYRINGE | INTRAMUSCULAR | Status: AC
  Filled 2023-09-18: qty 1

## 2023-09-18 MED ORDER — ACETAMINOPHEN 325 MG PO TABS
650.0000 mg | ORAL_TABLET | ORAL | Status: DC | PRN
  Administered 2023-09-19: 650 mg via ORAL
  Filled 2023-09-18: qty 2

## 2023-09-18 MED ORDER — DEXAMETHASONE SODIUM PHOSPHATE 10 MG/ML IJ SOLN
INTRAMUSCULAR | Status: AC
  Filled 2023-09-18: qty 1

## 2023-09-18 MED ORDER — FENTANYL CITRATE PF 50 MCG/ML IJ SOSY
25.0000 ug | PREFILLED_SYRINGE | INTRAMUSCULAR | Status: DC | PRN
  Administered 2023-09-18: 25 ug via INTRAVENOUS

## 2023-09-18 MED ORDER — ONDANSETRON HCL 4 MG/2ML IJ SOLN
4.0000 mg | INTRAMUSCULAR | Status: DC | PRN
Start: 2023-09-18 — End: 2023-09-19

## 2023-09-18 MED ORDER — KCL IN DEXTROSE-NACL 20-5-0.45 MEQ/L-%-% IV SOLN
INTRAVENOUS | Status: DC
  Filled 2023-09-18 (×3): qty 1000

## 2023-09-18 MED ORDER — ORAL CARE MOUTH RINSE: 15.0000 mL | Freq: Once | OROMUCOSAL | Status: AC

## 2023-09-18 MED ORDER — KETOROLAC TROMETHAMINE 15 MG/ML IJ SOLN
15.0000 mg | Freq: Four times a day (QID) | INTRAMUSCULAR | Status: DC
  Administered 2023-09-18 – 2023-09-19 (×3): 15 mg via INTRAVENOUS
  Filled 2023-09-18 (×3): qty 1

## 2023-09-18 SURGICAL SUPPLY — 56 items
APPLICATOR COTTON TIP 6 STRL (MISCELLANEOUS) ×2 IMPLANT
BAG COUNTER SPONGE SURGICOUNT (BAG) IMPLANT
CATH FOLEY 2WAY SLVR 18FR 30CC (CATHETERS) ×2 IMPLANT
CATH ROBINSON RED A/P 16FR (CATHETERS) ×2 IMPLANT
CATH ROBINSON RED A/P 8FR (CATHETERS) ×2 IMPLANT
CATH TIEMANN FOLEY 18FR 5CC (CATHETERS) ×2 IMPLANT
CHLORAPREP W/TINT 26 (MISCELLANEOUS) ×2 IMPLANT
CLIP LIGATING HEM O LOK PURPLE (MISCELLANEOUS) ×2 IMPLANT
COVER SURGICAL LIGHT HANDLE (MISCELLANEOUS) ×2 IMPLANT
COVER TIP SHEARS 8 DVNC (MISCELLANEOUS) ×2 IMPLANT
CUTTER ECHEON FLEX ENDO 45 340 (ENDOMECHANICALS) ×2 IMPLANT
DERMABOND ADVANCED .7 DNX12 (GAUZE/BANDAGES/DRESSINGS) ×2 IMPLANT
DRAPE ARM DVNC X/XI (DISPOSABLE) ×8 IMPLANT
DRAPE COLUMN DVNC XI (DISPOSABLE) ×2 IMPLANT
DRAPE SURG IRRIG POUCH 19X23 (DRAPES) ×2 IMPLANT
DRIVER NDL LRG 8 DVNC XI (INSTRUMENTS) ×2 IMPLANT
DRIVER NDLE LRG 8 DVNC XI (INSTRUMENTS) ×4 IMPLANT
DRSG TEGADERM 4X4.75 (GAUZE/BANDAGES/DRESSINGS) ×2 IMPLANT
ELECT PENCIL ROCKER SW 15FT (MISCELLANEOUS) ×2 IMPLANT
ELECT REM PT RETURN 15FT ADLT (MISCELLANEOUS) ×2 IMPLANT
FORCEPS BPLR LNG DVNC XI (INSTRUMENTS) ×2 IMPLANT
FORCEPS PROGRASP DVNC XI (FORCEP) ×2 IMPLANT
GAUZE SPONGE 4X4 12PLY STRL (GAUZE/BANDAGES/DRESSINGS) ×2 IMPLANT
GLOVE BIO SURGEON STRL SZ 6.5 (GLOVE) ×2 IMPLANT
GLOVE SURG LX STRL 7.5 STRW (GLOVE) ×4 IMPLANT
GOWN STRL REUS W/ TWL XL LVL3 (GOWN DISPOSABLE) ×4 IMPLANT
GOWN STRL SURGICAL XL XLNG (GOWN DISPOSABLE) ×2 IMPLANT
HEMOSTAT SURGICEL 4X8 (HEMOSTASIS) ×1 IMPLANT
HOLDER FOLEY CATH W/STRAP (MISCELLANEOUS) ×2 IMPLANT
IRRIGATION SUCT STRKRFLW 2 WTP (MISCELLANEOUS) ×2 IMPLANT
IV LACTATED RINGERS 1000ML (IV SOLUTION) ×2 IMPLANT
KIT TURNOVER KIT A (KITS) ×2 IMPLANT
NDL SAFETY ECLIPSE 18X1.5 (NEEDLE) IMPLANT
PACK ROBOT UROLOGY CUSTOM (CUSTOM PROCEDURE TRAY) ×2 IMPLANT
PLUG CATH AND CAP STRL 200 (CATHETERS) ×2 IMPLANT
RELOAD STAPLE 45 4.1 GRN THCK (STAPLE) ×2 IMPLANT
SCISSORS LAP 5X45 EPIX DISP (ENDOMECHANICALS) IMPLANT
SCISSORS MNPLR CVD DVNC XI (INSTRUMENTS) ×2 IMPLANT
SEAL UNIV 5-12 XI (MISCELLANEOUS) ×8 IMPLANT
SET TUBE SMOKE EVAC HIGH FLOW (TUBING) ×2 IMPLANT
SOL PREP POV-IOD 4OZ 10% (MISCELLANEOUS) ×2 IMPLANT
SOLUTION ELECTROSURG ANTI STCK (MISCELLANEOUS) ×2 IMPLANT
SPIKE FLUID TRANSFER (MISCELLANEOUS) ×2 IMPLANT
SUT ETHILON 3 0 PS 1 (SUTURE) ×2 IMPLANT
SUT MNCRL 3 0 RB1 (SUTURE) ×2 IMPLANT
SUT MNCRL 3 0 VIOLET RB1 (SUTURE) ×2 IMPLANT
SUT MNCRL AB 4-0 PS2 18 (SUTURE) ×4 IMPLANT
SUT NOVA 0 T19/GS 22DT (SUTURE) ×1 IMPLANT
SUT PDS PLUS AB 0 CT-2 (SUTURE) ×4 IMPLANT
SUT VIC AB 0 CT1 27XBRD ANTBC (SUTURE) ×4 IMPLANT
SUT VIC AB 0 CT2 27 (SUTURE) ×1 IMPLANT
SUT VIC AB 2-0 SH 27X BRD (SUTURE) ×2 IMPLANT
SUT VIC AB 3-0 SH 27X BRD (SUTURE) IMPLANT
SYR 27GX1/2 1ML LL SAFETY (SYRINGE) ×2 IMPLANT
TROCAR Z THREAD OPTICAL 12X100 (TROCAR) IMPLANT
WATER STERILE IRR 1000ML POUR (IV SOLUTION) ×2 IMPLANT

## 2023-09-18 NOTE — Transfer of Care (Signed)
 Immediate Anesthesia Transfer of Care Note  Patient: Theodore Whitney  Procedure(s) Performed: PROSTATECTOMY, RADICAL, ROBOT-ASSISTED, LAPAROSCOPIC  Patient Location: PACU  Anesthesia Type:General  Level of Consciousness: awake and alert   Airway & Oxygen Therapy: Patient Spontanous Breathing and Patient connected to face mask oxygen  Post-op Assessment: Report given to RN and Post -op Vital signs reviewed and stable  Post vital signs: Reviewed and stable  Last Vitals:  Vitals Value Taken Time  BP 110/65 09/18/23 14:24  Temp    Pulse 66 09/18/23 14:29  Resp 14 09/18/23 14:29  SpO2 98 % 09/18/23 14:29  Vitals shown include unfiled device data.  Last Pain:  Vitals:   09/18/23 0929  TempSrc: Oral  PainSc: 0-No pain      Patients Stated Pain Goal: 4 (09/18/23 0929)  Complications: No notable events documented.

## 2023-09-18 NOTE — Anesthesia Procedure Notes (Signed)
 Procedure Name: Intubation Date/Time: 09/18/2023 10:44 AM  Performed by: Deeann Eva BROCKS, CRNAPre-anesthesia Checklist: Patient identified, Emergency Drugs available, Suction available and Patient being monitored Patient Re-evaluated:Patient Re-evaluated prior to induction Oxygen Delivery Method: Circle System Utilized Preoxygenation: Pre-oxygenation with 100% oxygen Induction Type: IV induction Ventilation: Mask ventilation without difficulty Laryngoscope Size: Glidescope, 3, Mac and 4 Grade View: Grade II Tube type: Oral Tube size: 8.0 mm Number of attempts: 1 Airway Equipment and Method: Stylet and Oral airway Placement Confirmation: ETT inserted through vocal cords under direct vision, positive ETCO2 and breath sounds checked- equal and bilateral Secured at: 24 cm Tube secured with: Tape Dental Injury: Teeth and Oropharynx as per pre-operative assessment  Comments: Attempted intubation with 3 and 4 MAC blade x1 each, unsuccessful, no view of the cords. Glidescope 3 x1 successful.

## 2023-09-18 NOTE — Op Note (Addendum)
 Preoperative diagnosis: Clinically localized adenocarcinoma of the prostate (clinical stage T1c N0 Mx), umbilical hernia  Postoperative diagnosis: Clinically localized adenocarcinoma of the prostate (clinical stage T1c N0 Mx), umbilical hernia  Procedure:  Robotic assisted laparoscopic radical prostatectomy (bilateral nerve sparing) Umbilical hernia repair  Surgeon: Gretel CANDIE Ferrara, Mickey. M.D.  Resident: Dr. Lyle Civil   Anesthesia: General  Complications: None  EBL: 500 mL  IVF:  1400 mL crystalloid  Specimens: Prostate and seminal vesicles  Disposition of specimens: Pathology  Drains: 20 Fr coude catheter # 19 Blake pelvic drain  Indication: Theodore Whitney is a 58 y.o. year old patient with clinically localized prostate cancer.  After a thorough review of the management options for treatment of prostate cancer, he elected to proceed with surgical therapy and the above procedure(s).  We have discussed the potential benefits and risks of the procedure, side effects of the proposed treatment, the likelihood of the patient achieving the goals of the procedure, and any potential problems that might occur during the procedure or recuperation. Informed consent has been obtained.  Description of procedure:  The patient was taken to the operating room and a general anesthetic was administered. He was given preoperative antibiotics, placed in the dorsal lithotomy position, and prepped and draped in the usual sterile fashion. Next a preoperative timeout was performed. A urethral catheter was placed into the bladder and a site was selected near the umbilicus for placement of the camera port. This was placed using a standard open Hassan technique which allowed entry into the peritoneal cavity under direct vision and without difficulty. An 8 mm port was placed and a pneumoperitoneum established. The camera was then used to inspect the abdomen and there was no evidence of any  intra-abdominal injuries or other abnormalities. The remaining abdominal ports were then placed. 8 mm robotic ports were placed in the right lower quadrant, left lower quadrant, and far left lateral abdominal wall. A 5 mm port was placed in the right upper quadrant and a 12 mm port was placed in the right lateral abdominal wall for laparoscopic assistance. All ports were placed under direct vision without difficulty. The surgical cart was then docked.   Utilizing the cautery scissors, the bladder was reflected posteriorly allowing entry into the space of Retzius and identification of the endopelvic fascia and prostate. The periprostatic fat was then removed from the prostate allowing full exposure of the endopelvic fascia. The endopelvic fascia was then incised from the apex back to the base of the prostate bilaterally and the underlying levator muscle fibers were swept laterally off the prostate thereby isolating the dorsal venous complex. The dorsal vein was then stapled and divided with a 45 mm Flex Echelon stapler. Attention then turned to the bladder neck which was divided anteriorly thereby allowing entry into the bladder and exposure of the urethral catheter. The catheter balloon was deflated and the catheter was brought into the operative field and used to retract the prostate anteriorly. The posterior bladder neck was then examined and was divided allowing further dissection between the bladder and prostate posteriorly until the vasa deferentia and seminal vessels were identified. The vasa deferentia were isolated, divided, and lifted anteriorly. The seminal vesicles were dissected down to their tips with care to control the seminal vascular arterial blood supply. These structures were then lifted anteriorly and the space between Denonvillier's fascia and the anterior rectum was developed with a combination of sharp and blunt dissection. This isolated the vascular pedicles of the prostate.  The lateral  prostatic fascia was then sharply incised allowing release of the neurovascular bundles bilaterally. The vascular pedicles of the prostate were then ligated with Weck clips between the prostate and neurovascular bundles and divided with sharp cold scissor dissection resulting in neurovascular bundle preservation. The neurovascular bundles were then separated off the apex of the prostate and urethra bilaterally.  The urethra was then sharply transected allowing the prostate specimen to be disarticulated. The pelvis was copiously irrigated and hemostasis was ensured. There was no evidence for rectal injury.  Attention then turned to the urethral anastomosis. A 2-0 Vicryl slip knot was placed between Denonvillier's fascia, the posterior bladder neck, and the posterior urethra to reapproximate these structures. A double-armed 3-0 Monocryl suture was then used to perform a 360 running tension-free anastomosis between the bladder neck and urethra. A new urethral catheter was then placed into the bladder and irrigated. There were no blood clots within the bladder and the anastomosis appeared to be watertight. A #19 Blake drain was then brought through the left lateral 8 mm port site and positioned appropriately within the pelvis. It was secured to the skin with a nylon suture. The surgical cart was then undocked. The right lateral 12 mm port site was closed at the fascial level with a 0 Vicryl suture placed laparoscopically. All remaining ports were then removed under direct vision. The prostate specimen was removed intact within the Endopouch retrieval bag via the periumbilical camera port site. The fascial incision was then opened around the umbilicus down to the site of his umbilical hernia and the hernia sac was identified and opened.  The fascial edges were identified and figure of eight 0-Novofil sutures were used to close this incision and repair the hernia. 0.25% Marcaine  was then injected into all port sites  and all incisions were reapproximated at the skin level with 4-0 Monocryl subcuticular sutures and liquid skin adhesive. The patient appeared to tolerate the procedure well and without complications. The patient was able to be extubated and transferred to the recovery unit in satisfactory condition.  Gretel CANDIE Renda Teddie MD

## 2023-09-18 NOTE — Anesthesia Preprocedure Evaluation (Addendum)
 Anesthesia Evaluation  Patient identified by MRN, date of birth, ID band Patient awake    Reviewed: Allergy & Precautions, NPO status , Patient's Chart, lab work & pertinent test results  Airway Mallampati: III  TM Distance: >3 FB Neck ROM: Full    Dental no notable dental hx.    Pulmonary neg pulmonary ROS   Pulmonary exam normal        Cardiovascular hypertension, Pt. on medications Normal cardiovascular exam     Neuro/Psych negative neurological ROS  negative psych ROS   GI/Hepatic negative GI ROS, Neg liver ROS,,,  Endo/Other  negative endocrine ROS    Renal/GU negative Renal ROS     Musculoskeletal negative musculoskeletal ROS (+)    Abdominal  (+) + obese  Peds  Hematology negative hematology ROS (+)   Anesthesia Other Findings PROSTATE CANCER  Reproductive/Obstetrics                              Anesthesia Physical Anesthesia Plan  ASA: 2  Anesthesia Plan: General   Post-op Pain Management:    Induction: Intravenous  PONV Risk Score and Plan: 2 and Ondansetron , Dexamethasone , Midazolam  and Treatment may vary due to age or medical condition  Airway Management Planned: Oral ETT  Additional Equipment:   Intra-op Plan:   Post-operative Plan: Extubation in OR  Informed Consent: I have reviewed the patients History and Physical, chart, labs and discussed the procedure including the risks, benefits and alternatives for the proposed anesthesia with the patient or authorized representative who has indicated his/her understanding and acceptance.     Dental advisory given  Plan Discussed with: CRNA  Anesthesia Plan Comments:          Anesthesia Quick Evaluation

## 2023-09-18 NOTE — Progress Notes (Signed)
 Patient ID: Theodore Whitney, male   DOB: 26-May-1965, 58 y.o.   MRN: [id number]  Post-op note  Subjective: The patient is doing well.  No complaints.  Objective: Vital signs in last 24 hours: Temp:  [97.1 F (36.2 C)-99.3 F (37.4 C)] 98.1 F (36.7 C) (08/18 1611) Pulse Rate:  [65-81] 81 (08/18 1600) Resp:  [12-20] 16 (08/18 1600) BP: (109-144)/(64-86) 131/73 (08/18 1600) SpO2:  [89 %-100 %] 100 % (08/18 1600) Weight:  [105.2 kg] 105.2 kg (08/18 1611)  Intake/Output from previous day: No intake/output data recorded. Intake/Output this shift: Total I/O In: 1626.4 [I.V.:1276.4; IV Piggyback:350] Out: 520 [Drains:20; Blood:500]  Physical Exam:  General: Alert and oriented. Abdomen: Soft, Nondistended. Incisions: Clean and dry. GU: Urine clearing.  Lab Results: Recent Labs    09/18/23 1449  HGB 11.1*  HCT 33.3*    Assessment/Plan: POD#0   1) Continue to monitor, ambulate, IS   Theodore Whitney. MD   LOS: 0 days   Theodore Whitney 09/18/2023, 4:38 PM

## 2023-09-18 NOTE — Interval H&P Note (Signed)
 History and Physical Interval Note:  09/18/2023 9:56 AM  Theodore Whitney  has presented today for surgery, with the diagnosis of PROSTATE CANCER.  The various methods of treatment have been discussed with the patient and family. After consideration of risks, benefits and other options for treatment, the patient has consented to  Procedure(s) with comments: PROSTATECTOMY, RADICAL, ROBOT-ASSISTED, LAPAROSCOPIC (N/A) - LEVEL 2 LYMPHADENECTOMY, PELVIS, ROBOT-ASSISTED (Bilateral) as a surgical intervention.  The patient's history has been reviewed, patient examined, no change in status, stable for surgery.  I have reviewed the patient's chart and labs.  Questions were answered to the patient's satisfaction.     [employer]

## 2023-09-18 NOTE — Anesthesia Postprocedure Evaluation (Signed)
 Anesthesia Post Note  Patient: Theodore Whitney  Procedure(s) Performed: PROSTATECTOMY, RADICAL, ROBOT-ASSISTED, LAPAROSCOPIC     Patient location during evaluation: PACU Anesthesia Type: General Level of consciousness: awake Pain management: pain level controlled Vital Signs Assessment: post-procedure vital signs reviewed and stable Respiratory status: spontaneous breathing, nonlabored ventilation and respiratory function stable Cardiovascular status: blood pressure returned to baseline and stable Postop Assessment: no apparent nausea or vomiting Anesthetic complications: no   No notable events documented.  Last Vitals:  Vitals:   09/18/23 1600 09/18/23 1611  BP: 131/73   Pulse: 81   Resp: 16   Temp: 36.7 C 36.7 C  SpO2: 100%     Last Pain:  Vitals:   09/18/23 1717  TempSrc:   PainSc: 5                  Julian Askin P Constanza Mincy

## 2023-09-18 NOTE — Plan of Care (Signed)
 Patient has a lot of family/friend support, and expressed understanding re: care plan.

## 2023-09-19 ENCOUNTER — Encounter (HOSPITAL_COMMUNITY): Payer: Self-pay | Admitting: Urology

## 2023-09-19 DIAGNOSIS — C61 Malignant neoplasm of prostate: Secondary | ICD-10-CM | POA: Diagnosis not present

## 2023-09-19 LAB — HEMOGLOBIN AND HEMATOCRIT, BLOOD
HCT: 31 % — ABNORMAL LOW (ref 39.0–52.0)
Hemoglobin: 10.4 g/dL — ABNORMAL LOW (ref 13.0–17.0)

## 2023-09-19 MED ORDER — CELECOXIB 200 MG PO CAPS: 200.0000 mg | ORAL_CAPSULE | Freq: Every day | ORAL | 0 refills | Status: AC

## 2023-09-19 MED ORDER — CHLORHEXIDINE GLUCONATE CLOTH 2 % EX PADS: 6.0000 | MEDICATED_PAD | Freq: Every day | CUTANEOUS | Status: DC

## 2023-09-19 MED ORDER — SULFAMETHOXAZOLE-TRIMETHOPRIM 800-160 MG PO TABS: 1.0000 | ORAL_TABLET | Freq: Two times a day (BID) | ORAL | 0 refills | Status: AC

## 2023-09-19 MED ORDER — ACETAMINOPHEN 325 MG PO TABS: 650.0000 mg | ORAL_TABLET | Freq: Four times a day (QID) | ORAL | 0 refills | Status: AC

## 2023-09-19 MED ORDER — TRAMADOL HCL 50 MG PO TABS: 50.0000 mg | ORAL_TABLET | Freq: Four times a day (QID) | ORAL | Status: DC | PRN

## 2023-09-19 MED ORDER — TRAMADOL HCL 50 MG PO TABS: 50.0000 mg | ORAL_TABLET | Freq: Four times a day (QID) | ORAL | 0 refills | Status: AC | PRN

## 2023-09-19 MED ORDER — BISACODYL 10 MG RE SUPP
10.0000 mg | Freq: Once | RECTAL | Status: AC
  Administered 2023-09-19: 10 mg via RECTAL
  Filled 2023-09-19: qty 1

## 2023-09-19 NOTE — Discharge Summary (Signed)
 Physician Discharge Summary   Patient: Theodore Whitney MRN: [id number] DOB: 10/03/1965  Admit date:     09/18/2023  Discharge date: 09/19/23  Discharge Physician: Lyle LOISE Civil   PCP: Rollene Almarie LABOR, MD    Discharge Diagnoses: Principal Problem:   Prostate cancer [facility])  Resolved Problems:   * No resolved hospital problems. [facility] Course: Patient is s/p RALP (bilateral nerve sparing) and umbilical hernia repair on 09/18/23. He tolerated the procedure well and was taken to the Floor from PACU.   By POD1, he was tolerating a clear liquid diet, was ambulating, and had appropriate pain control with oral medications. He was discharged on POD1 with plan to return to clinic in 5-7 days for Foley catheter removal.   Pain control - [place]  Controlled Substance Reporting System database was reviewed. and patient was instructed, not to drive, operate heavy machinery, perform activities at heights, swimming or participation in water  activities or provide baby-sitting services while on Pain, Sleep and Anxiety Medications; until their outpatient Physician has advised to do so again. Also recommended to not to take more than prescribed Pain, Sleep and Anxiety Medications.   Consultants: None Procedures performed: RALP and umbilical hernia repair   Disposition: Home Diet recommendation:  Clear liquid diet, advance as tolerated  DISCHARGE MEDICATION: Allergies as of 09/19/2023   No Known Allergies      Medication List     TAKE these medications    acetaminophen  325 MG tablet Commonly known as: TYLENOL  Take 2 tablets (650 mg total) by mouth every 6 (six) hours for 7 days.   celecoxib  200 MG capsule Commonly known as: CeleBREX  Take 1 capsule (200 mg total) by mouth daily for 7 days.   hydrochlorothiazide  25 MG tablet Commonly known as: HYDRODIURIL  Take 1 tablet (25 mg total) by mouth daily.   simvastatin  40 MG tablet Commonly known as: ZOCOR  Take 1 tablet (40  mg total) by mouth at bedtime. What changed: when to take this   sulfamethoxazole -trimethoprim  800-160 MG tablet Commonly known as: BACTRIM  DS Take 1 tablet by mouth 2 (two) times daily. Begin the day prior to catheter removal   traMADol  50 MG tablet Commonly known as: ULTRAM  Take 1-2 tablets (50-100 mg total) by mouth every 6 (six) hours as needed (moderate to severe pain).        Follow-up Information     [facility] Follow up.   Why: As scheduled next week Contact information: [address] [place]  [address] [phone]                Discharge Exam: Fredricka Weights   09/18/23 1611  Weight: 105.2 kg    Condition at discharge: good  The results of significant diagnostics from this hospitalization (including imaging, microbiology, ancillary and laboratory) are listed below for reference.   Imaging Studies: No results found.  Microbiology: Results for orders placed or performed in visit on 05/02/19  GC/Chlamydia Probe Amp     Status: None   Collection Time: 05/02/19 12:45 PM   Specimen: Other   OTHER  RELEASE TO PATI  Result Value Ref Range Status   Chlamydia trachomatis, NAA Negative Negative Final   Neisseria Gonorrhoeae by PCR Negative Negative Final  CULTURE, URINE COMPREHENSIVE     Status: None   Collection Time: 05/02/19 12:45 PM  Result Value Ref Range Status   Source: NOT GIVEN  Final   Status: FINAL  Final   RESULT: No  Growth  Final    Labs: CBC: Recent Labs  Lab 09/18/23 1449 09/19/23 0527  HGB 11.1* 10.4*  HCT 33.3* 31.0*   Basic Metabolic Panel: No results for input(s): NA, K, CL, CO2, GLUCOSE, BUN, CREATININE, CALCIUM, MG, PHOS in the last 168 hours. Liver Function Tests: No results for input(s): AST, ALT, ALKPHOS, BILITOT, PROT, ALBUMIN  in the last 168 hours. CBG: No results for input(s): GLUCAP in the last 168 hours.  Discharge time spent: greater than 30  minutes.  Signed: Lyle LOISE Civil, MD [facility] 09/19/2023

## 2023-09-19 NOTE — TOC Initial Note (Signed)
 Transition of Care [facility]) - Initial/Assessment Note    Patient Details  Name: Theodore Whitney MRN: [id number] Date of Birth: 1965/03/19  Transition of Care [facility]) CM/SW Contact:    Bascom Service, RN Phone Number: 09/19/2023, 9:57 AM  Clinical Narrative: d/c plan home.                    Barriers to Discharge: Continued Medical Work up   Patient Goals and CMS Choice Patient states their goals for this hospitalization and ongoing recovery are:: Home CMS Medicare.gov Compare Post Acute Care list provided to:: Patient Choice offered to / list presented to : Patient  ownership interest in [facility].provided to:: Patient    Expected Discharge Plan and Services                                              Prior Living Arrangements/Services                       Activities of Daily Living   ADL Screening (condition at time of admission) Independently performs ADLs?: Yes (appropriate for developmental age) Is the patient deaf or have difficulty hearing?: No Does the patient have difficulty seeing, even when wearing glasses/contacts?: No Does the patient have difficulty concentrating, remembering, or making decisions?: No  Permission Sought/Granted                  Emotional Assessment              Admission diagnosis:  Prostate cancer [facility]) [C61] Patient Active Problem List   Diagnosis Date Noted   Prostate cancer (HCC) 09/18/2023   Malignant neoplasm of prostate (HCC) 05/15/2023   Right flank pain 10/31/2022   Pre-diabetes 11/03/2021   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 [place] [address] Phone:  [phone] Fax: [phone]  [facility] - [place], [place] - [address] [place] [address] Phone: [phone] Fax: [phone]     Social Drivers of Health (SDOH) Social History: SDOH Screenings   Food Insecurity: No Food Insecurity (09/18/2023)  Housing: Low Risk  (09/18/2023)  Transportation Needs: No Transportation Needs (09/18/2023)  Utilities: Not At Risk (09/18/2023)  Alcohol Screen: Low Risk  (05/15/2023)  Depression (PHQ2-9): Low Risk  (05/15/2023)  Tobacco Use: Low Risk  (09/18/2023)   SDOH Interventions:     Readmission Risk Interventions     No data to display

## 2023-09-19 NOTE — Discharge Instructions (Signed)
Activity:  You are encouraged to ambulate frequently (about every hour during waking hours) to help prevent blood clots from forming in your legs or lungs.  However, you should not engage in any heavy lifting (> 10-15 lbs), strenuous activity, or straining. Diet: You should continue a clear liquid diet until passing gas from below.  Once this occurs, you may advance your diet to a soft diet that would be easy to digest (i.e soups, scrambled eggs, mashed potatoes, etc.) for 24 hours just as you would if getting over a bad stomach flu.  If tolerating this diet well for 24 hours, you may then begin eating regular food.  It will be normal to have some amount of bloating, nausea, and abdominal discomfort intermittently. Prescriptions:  You will be provided a prescription for pain medication to take as needed.  If your pain is not severe enough to require the prescription pain medication, you may take extra strength Tylenol instead.  You should also take an over the counter stool softener (Colace 100 mg twice daily) to avoid straining with bowel movements as the pain medication may constipate you. Finally, you will also be provided a prescription for an antibiotic to begin the day prior to your return visit in the office for catheter removal. Catheter care: You will be taught how to take care of the catheter by the nursing staff prior to discharge from the hospital.  You may use both a leg bag and the larger bedside bag but it is recommended to at least use the bigger bedside bag at nighttime as the leg bag is small and will fill up overnight and also does not drain as well when lying flat. You may periodically feel a strong urge to void with the catheter in place.  This is a bladder spasm and most often can occur when having a bowel movement or when you are moving around. It is typically self-limited and usually will stop after a few minutes.  You may use some Vaseline or Neosporin around the tip of the catheter to  reduce friction at the tip of the penis. Incisions:  You may start showering (not soaking or bathing in water) 48 hours after surgery and the incisions simply need to be patted dry after the shower.  No additional care is needed. What to call us about: You should call the office ([phone]) if you develop fever > 101, persistent vomiting, or the catheter stops draining. Also, feel free to call with any other questions you may have and remember the handout that was provided to you as a reference preoperatively which answers many of the common questions that arise after surgery.  

## 2023-09-19 NOTE — Progress Notes (Addendum)
 1 Day Post-Op Subjective: The patient is doing well.  No nausea or vomiting. Pain is adequately controlled.  Objective: Vital signs in last 24 hours: Temp:  [97.1 F (36.2 C)-100.2 F (37.9 C)] 98.1 F (36.7 C) (08/19 0501) Pulse Rate:  [65-97] 80 (08/19 0501) Resp:  [12-20] 16 (08/19 0501) BP: (109-144)/(64-86) 121/76 (08/19 0501) SpO2:  [89 %-100 %] 100 % (08/19 0501) Weight:  [105.2 kg] 105.2 kg (08/18 1611)  Intake/Output from previous day: 08/18 0701 - 08/19 0700 In: 4911.4 [P.O.:480; I.V.:2981.4; IV Piggyback:1450] Out: 2070 [Urine:1475; Drains:95; Blood:500] Intake/Output this shift: No intake/output data recorded.  Physical Exam:  General: Alert and oriented. CV: RRR Lungs: Clear bilaterally. GI: Soft, Nondistended. Drain with s/s in bulb  Incisions: Clean, dry, and intact Urine: Clear Extremities: Nontender, no erythema, no edema.  Lab Results: Recent Labs    09/18/23 1449 09/19/23 0527  HGB 11.1* 10.4*  HCT 33.3* 31.0*      Assessment/Plan: POD# 1 s/p robotic prostatectomy.  1) ML IVF 2) Ambulate, Incentive spirometry 3) Transition to oral pain medication 4) Bowel regimen  5) D/C pelvic drain prior to discharge 6) Plan for likely discharge later today    LOS: 0 days   Theodore Whitney 09/19/2023, 7:32 AM

## 2023-09-21 LAB — SURGICAL PATHOLOGY

## 2023-10-17 ENCOUNTER — Other Ambulatory Visit: Payer: Self-pay | Admitting: Internal Medicine

## 2023-10-17 DIAGNOSIS — E782 Mixed hyperlipidemia: Secondary | ICD-10-CM

## 2023-11-30 ENCOUNTER — Other Ambulatory Visit: Payer: Self-pay | Admitting: Internal Medicine

## 2023-11-30 DIAGNOSIS — E782 Mixed hyperlipidemia: Secondary | ICD-10-CM

## 2023-12-03 ENCOUNTER — Other Ambulatory Visit: Payer: Self-pay | Admitting: Internal Medicine

## 2023-12-03 DIAGNOSIS — I10 Essential (primary) hypertension: Secondary | ICD-10-CM

## 2023-12-06 ENCOUNTER — Encounter: Payer: Self-pay | Admitting: Internal Medicine

## 2023-12-06 ENCOUNTER — Ambulatory Visit (INDEPENDENT_AMBULATORY_CARE_PROVIDER_SITE_OTHER): Admitting: Internal Medicine

## 2023-12-06 VITALS — BP 142/80 | HR 68 | Temp 98.8°F | Ht 73.0 in | Wt 244.0 lb

## 2023-12-06 DIAGNOSIS — Z0000 Encounter for general adult medical examination without abnormal findings: Secondary | ICD-10-CM | POA: Diagnosis not present

## 2023-12-06 DIAGNOSIS — Z8546 Personal history of malignant neoplasm of prostate: Secondary | ICD-10-CM

## 2023-12-06 DIAGNOSIS — Z23 Encounter for immunization: Secondary | ICD-10-CM

## 2023-12-06 DIAGNOSIS — E782 Mixed hyperlipidemia: Secondary | ICD-10-CM

## 2023-12-06 DIAGNOSIS — I10 Essential (primary) hypertension: Secondary | ICD-10-CM | POA: Diagnosis not present

## 2023-12-06 DIAGNOSIS — R7303 Prediabetes: Secondary | ICD-10-CM | POA: Diagnosis not present

## 2023-12-06 LAB — COMPREHENSIVE METABOLIC PANEL WITH GFR
ALT: 21 U/L (ref 0–53)
AST: 15 U/L (ref 0–37)
Albumin: 4.2 g/dL (ref 3.5–5.2)
Alkaline Phosphatase: 79 U/L (ref 39–117)
BUN: 14 mg/dL (ref 6–23)
CO2: 26 meq/L (ref 19–32)
Calcium: 8.7 mg/dL (ref 8.4–10.5)
Chloride: 106 meq/L (ref 96–112)
Creatinine, Ser: 1.07 mg/dL (ref 0.40–1.50)
GFR: 77.73 mL/min (ref >60.00)
Glucose, Bld: 122 mg/dL — ABNORMAL HIGH (ref 70–99)
Potassium: 3.6 meq/L (ref 3.5–5.1)
Sodium: 139 meq/L (ref 135–145)
Total Bilirubin: 0.6 mg/dL (ref 0.2–1.2)
Total Protein: 6.9 g/dL (ref 6.0–8.3)

## 2023-12-06 LAB — LIPID PANEL
Cholesterol: 148 mg/dL (ref 0–200)
HDL: 40 mg/dL (ref >39.00)
LDL Cholesterol: 91 mg/dL (ref 0–99)
NonHDL: 107.95
Total CHOL/HDL Ratio: 4
Triglycerides: 87 mg/dL (ref 0.0–149.0)
VLDL: 17.4 mg/dL (ref 0.0–40.0)

## 2023-12-06 LAB — CBC
HCT: 37.2 % — ABNORMAL LOW (ref 39.0–52.0)
Hemoglobin: 12.6 g/dL — ABNORMAL LOW (ref 13.0–17.0)
MCHC: 34 g/dL (ref 30.0–36.0)
MCV: 96.1 fl (ref 78.0–100.0)
Platelets: 242 K/uL (ref 150.0–400.0)
RBC: 3.87 Mil/uL — ABNORMAL LOW (ref 4.22–5.81)
RDW: 13.8 % (ref 11.5–15.5)
WBC: 4.8 K/uL (ref 4.0–10.5)

## 2023-12-06 LAB — HEMOGLOBIN A1C: Hgb A1c MFr Bld: 6 % (ref 4.6–6.5)

## 2023-12-06 MED ORDER — VALSARTAN 320 MG PO TABS: 320.0000 mg | ORAL_TABLET | Freq: Every day | ORAL | 3 refills | Status: DC

## 2023-12-06 NOTE — Patient Instructions (Addendum)
 We will have you stop the hydrochlorothiazide  and start valsartan instead to take 1 pill daily.  Get the pneumonia shot anytime.

## 2023-12-06 NOTE — Assessment & Plan Note (Signed)
 Checking lipid panel and adjust as needed.

## 2023-12-06 NOTE — Assessment & Plan Note (Signed)
 Flu shot given. Pneumonia counseled. Shingrix  compelte. Tetanus up to date. Colonoscopy due next year. Counseled about sun safety and mole surveillance. Counseled about the dangers of distracted driving. Given 10 year screening recommendations.

## 2023-12-06 NOTE — Progress Notes (Signed)
   Subjective:   Patient ID: Theodore Whitney, male    DOB: 1965/06/12, 58 y.o.   MRN: [id number]  The patient is here for physical. Pertinent topics discussed: Discussed the use of AI scribe software for clinical note transcription with the patient, who gave verbal consent to proceed.  History of Present Illness Theodore Whitney is a 58 year old male with prostate cancer who presents for follow-up after prostate surgery.  He underwent prostate surgery on September 18, 2023, which proceeded smoothly without the need for additional treatments such as radiation. He is scheduled for a post-operative PSA check with his urologist in early December.  He experiences some urinary issues post-surgery, which he attributes to his current blood pressure medication, hydrochlorothiazide , a diuretic. He is currently taking a low dose of tadalafil, which he finds helpful for improving blood flow and managing urinary symptoms. He reports gradual improvement in urinary control, noting that he can wear regular underwear during the day but uses a pull-up at night.  He has a family history of prostate cancer, with two brothers also affected; one underwent surgery and the other received brachytherapy.  No new chest pain, breathing difficulties, or gastrointestinal issues. He reports knee pain, particularly behind the knee, which he manages with stretching. He engages in regular walking exercises with his dog and performs physical therapy exercises.  PMH, [facility], social history reviewed and updated  Review of Systems  Constitutional: Negative.   HENT: Negative.    Eyes: Negative.   Respiratory:  Negative for cough, chest tightness and shortness of breath.   Cardiovascular:  Negative for chest pain, palpitations and leg swelling.  Gastrointestinal:  Negative for abdominal distention, abdominal pain, constipation, diarrhea, nausea and vomiting.  Musculoskeletal: Negative.   Skin: Negative.   Neurological: Negative.    Psychiatric/Behavioral: Negative.      Objective:  Physical Exam Constitutional:      Appearance: He is well-developed.  HENT:     Head: Normocephalic and atraumatic.  Cardiovascular:     Rate and Rhythm: Normal rate and regular rhythm.  Pulmonary:     Effort: Pulmonary effort is normal. No respiratory distress.     Breath sounds: Normal breath sounds. No wheezing or rales.  Abdominal:     General: Bowel sounds are normal. There is no distension.     Palpations: Abdomen is soft.     Tenderness: There is no abdominal tenderness.  Musculoskeletal:     Cervical back: Normal range of motion.  Skin:    General: Skin is warm and dry.  Neurological:     Mental Status: He is alert and oriented to person, place, and time.     Coordination: Coordination normal.     Vitals:   12/06/23 0844 12/06/23 0905  BP: (!) 150/82 (!) 142/80  Pulse: 68   Temp: 98.8 F (37.1 C)   TempSrc: Oral   SpO2: 97%   Weight: 244 lb (110.7 kg)   Height: 6' 1 (1.854 m)     Assessment & Plan:  Flu shot given at visit

## 2023-12-06 NOTE — Assessment & Plan Note (Signed)
 Having leaking which is improving since prostatectomy. We will temporarily change hydrochlorothiazide  25 mg to valsartan 320 mg daily and he will watch BP readings. Checking CMP

## 2023-12-06 NOTE — Assessment & Plan Note (Signed)
 Checking HGA1c and adjust as needed.

## 2023-12-06 NOTE — Assessment & Plan Note (Signed)
 S/P prostatectomy and first PSA with urology dec 2025 post-op.

## 2023-12-11 ENCOUNTER — Ambulatory Visit: Payer: Self-pay | Admitting: Internal Medicine

## 2023-12-12 ENCOUNTER — Encounter: Admitting: Internal Medicine

## 2024-02-16 ENCOUNTER — Telehealth: Payer: Self-pay

## 2024-02-16 ENCOUNTER — Other Ambulatory Visit: Payer: Self-pay

## 2024-02-16 DIAGNOSIS — I10 Essential (primary) hypertension: Secondary | ICD-10-CM

## 2024-02-16 MED ORDER — VALSARTAN 320 MG PO TABS: 320.0000 mg | ORAL_TABLET | Freq: Every day | ORAL | 0 refills | Status: AC

## 2024-02-16 MED ORDER — VALSARTAN 320 MG PO TABS: 320.0000 mg | ORAL_TABLET | Freq: Every day | ORAL | 3 refills | Status: DC

## 2024-02-16 NOTE — Telephone Encounter (Signed)
 Copied from CRM [id number]. Topic: Clinical - Prescription Issue >> Feb 16, 2024  1:42 PM Deleta RAMAN wrote: Reason for CRM: patient is out of valsartan  (DIOVAN ) 320 MG tablet [facility]  and requesting 90 day supply for [facility] continuing instead of receiving 30 days as [facility]. Please contact [phone]

## 2024-02-16 NOTE — Telephone Encounter (Signed)
 Spoke with pt and notified that rx 30 day supply was sent to local pharmacy along with 90 day supply sent to mail order pharmacy.

## 2024-02-16 NOTE — Telephone Encounter (Signed)
 Copied from CRM [id number]. Topic: Clinical - Medication Refill >> Feb 16, 2024  1:41 PM Deleta S wrote: Medication: valsartan  (DIOVAN ) 320 MG tablet  Has the patient contacted their pharmacy? Yes (Agent: If no, request that the patient contact the pharmacy for the refill. If patient does not wish to contact the pharmacy document the reason why and proceed with request.) (Agent: If yes, when and what did the pharmacy advise?)  This is the patient's preferred pharmacy:   [facility] - [place], [place] - [address] [place] [address] Phone: [phone] Fax: [phone]  Is this the correct pharmacy for this prescription? Yes If no, delete pharmacy and type the correct one.   Has the prescription been filled recently? No  Is the patient out of the medication? Yes  Has the patient been seen for an appointment in the last year OR does the patient have an upcoming appointment? Yes  Can we respond through MyChart? Yes  Agent: Please be advised that Rx refills may take up to 3 business days. We ask that you follow-up with your pharmacy.
